# Patient Record
Sex: Female | Born: 1945
Health system: Southern US, Community
[De-identification: ages and names within clinical notes are randomized; demographics above are authoritative.]

## PROBLEM LIST (undated history)

## (undated) DIAGNOSIS — I340 Nonrheumatic mitral (valve) insufficiency: Secondary | ICD-10-CM

## (undated) DIAGNOSIS — N209 Urinary calculus, unspecified: Secondary | ICD-10-CM

## (undated) DIAGNOSIS — M4846XA Fatigue fracture of vertebra, lumbar region, initial encounter for fracture: Secondary | ICD-10-CM

## (undated) DIAGNOSIS — E785 Hyperlipidemia, unspecified: Secondary | ICD-10-CM

## (undated) DIAGNOSIS — F0280 Dementia in other diseases classified elsewhere without behavioral disturbance: Secondary | ICD-10-CM

## (undated) DIAGNOSIS — I10 Essential (primary) hypertension: Secondary | ICD-10-CM

## (undated) HISTORY — DX: Dementia in other diseases classified elsewhere, unspecified severity, without behavioral disturbance, psychotic disturbance, mood disturbance, and anxiety: F02.80

## (undated) HISTORY — DX: Nonrheumatic mitral (valve) insufficiency: I34.0

## (undated) HISTORY — DX: Urinary calculus, unspecified: N20.9

## (undated) HISTORY — DX: Hyperlipidemia, unspecified: E78.5

## (undated) HISTORY — PX: CHOLECYSTECTOMY: SHX55

## (undated) HISTORY — DX: Fatigue fracture of vertebra, lumbar region, initial encounter for fracture: M48.46XA

## (undated) HISTORY — DX: Essential (primary) hypertension: I10

## (undated) HISTORY — PX: ABDOMINAL HYSTERECTOMY: SHX81

---

## 2014-04-12 ENCOUNTER — Ambulatory Visit: Payer: Self-pay | Admitting: Internal Medicine

## 2014-04-19 ENCOUNTER — Ambulatory Visit (INDEPENDENT_AMBULATORY_CARE_PROVIDER_SITE_OTHER): Payer: Medicare HMO | Admitting: Podiatry

## 2014-04-19 ENCOUNTER — Ambulatory Visit (INDEPENDENT_AMBULATORY_CARE_PROVIDER_SITE_OTHER): Payer: Medicare HMO

## 2014-04-19 ENCOUNTER — Encounter: Payer: Self-pay | Admitting: Podiatry

## 2014-04-19 VITALS — BP 129/67 | HR 58 | Resp 16 | Ht 64.0 in | Wt 115.0 lb

## 2014-04-19 DIAGNOSIS — M216X1 Other acquired deformities of right foot: Secondary | ICD-10-CM

## 2014-04-19 DIAGNOSIS — M79609 Pain in unspecified limb: Secondary | ICD-10-CM

## 2014-04-19 DIAGNOSIS — M2140 Flat foot [pes planus] (acquired), unspecified foot: Secondary | ICD-10-CM

## 2014-04-19 DIAGNOSIS — M216X2 Other acquired deformities of left foot: Secondary | ICD-10-CM

## 2014-04-19 DIAGNOSIS — M779 Enthesopathy, unspecified: Secondary | ICD-10-CM

## 2014-04-19 NOTE — Progress Notes (Signed)
   Subjective:    Patient ID: Cindy Patel, female    DOB: 1946-05-11, 68 y.o.   MRN: [id number]  HPI Comments: Cindy Patel, 68 year old female, presents the office today with bilateral arch pain as well as spasms in her feet. She states it has been ongoing for between 2-4 years and has been getting worse. She states that she at times has pain in her feet at night as well as with ambulation. He typically wears high heeled shoes or platform shoes. She stands a lot while working on the computer and status sitting. While standing she continues to wear heeled shoes. She denies any cramps in her legs during the day or any pain in the legs with ambulation. No numbness or tingling. No history of trauma or injury to the area. No other complaints at this time.  Foot Pain      Review of Systems  HENT: Positive for sinus pressure.   Skin:       Change in nails  Allergic/Immunologic: Positive for environmental allergies.       Objective:   Physical Exam AAO x3, NAD DP/PT pulses palpable 2/4 b/l, CRT < 3 sec Bursitis sensation intact with Simms Weinstein monofilament, vibratory sensation intact, Achilles tendon reflex intact. No pinpoint bony tenderness or pain with vibratory sensation. Decrease in medial arch height upon weightbearing. Equinus bilaterally. Fat pad atrophy on the metatarsal heads. No pain along the course the metatarsal heads. No pain along the course the digits. No pain along the calcaneus at the insertion of the plantar fascia. ROM WNL, MMT 5/5 No open skin lesions or pre-ulcerative lesions. No leg pain, swelling, warmth.        Assessment & Plan:  68 year old female with bilateral flatfoot deformity with arch pain, cramping and her toes. -X-Rays were obtained and reviewed with the patient. -Conservative versus surgical treatment were discussed including alternatives, risks, complications. -Discussed that given her flatfeet with the increase standing and high-heeled  shoes recently this is likely contributing to her arch pain. Discussed use of supportive shoe with an orthotic to help accommodate her foot type with help support her arch.  -Discussed stretching exercises. -Cramping is not likely due to vascular origin and she has no claudication symptoms (able to exercise/ambulate without pain/cramping) and she good pulses and this has been going on for many years. This is likely due to biomechanics however if the pain persists we'll rule out other causes. -Followup as needed or call with any questions, concerns, change in symptoms. Also call if symptoms persist.

## 2014-05-03 ENCOUNTER — Telehealth: Payer: Self-pay | Admitting: *Deleted

## 2014-05-03 NOTE — Telephone Encounter (Signed)
Called and spoke with pt letting her know per dr Jacqualyn Posey she can go to [employer] in [place] to get inserts for her shoes. Pt understood.

## 2014-05-03 NOTE — Telephone Encounter (Signed)
Pt came in stated you told her she needed arch supports for her shoes. Stated you never told her where to get them. She is wanting over the counter inserts for high heel / dress shoes. Where do you suggest she go to purchase these?

## 2015-06-13 ENCOUNTER — Encounter: Payer: Self-pay | Admitting: Physical Therapy

## 2015-06-13 ENCOUNTER — Ambulatory Visit: Payer: PPO | Attending: Neurosurgery | Admitting: Physical Therapy

## 2015-06-13 DIAGNOSIS — R29898 Other symptoms and signs involving the musculoskeletal system: Secondary | ICD-10-CM | POA: Insufficient documentation

## 2015-06-13 DIAGNOSIS — M5442 Lumbago with sciatica, left side: Secondary | ICD-10-CM | POA: Diagnosis not present

## 2015-06-13 DIAGNOSIS — M2560 Stiffness of unspecified joint, not elsewhere classified: Secondary | ICD-10-CM | POA: Diagnosis present

## 2015-06-13 DIAGNOSIS — M5386 Other specified dorsopathies, lumbar region: Secondary | ICD-10-CM

## 2015-06-13 NOTE — Patient Instructions (Signed)
Side Stretch    With feet shoulder width apart, hands on hips, inhale. Then exhale while bending directly to Left  side, keeping top arm outstretched. Hold 3 count. Slowly return to starting position.  Repeat _12___ times, each side.  Copyright  VHI. All rights reserved.  Abductor Strength: Side Plank Pose, on Knees    On Right side, Press down with bottom knee to lift hips. Hold for __5-10 sceonds. Repeat __5 __ times   Copyright  VHI. All rights reserved.

## 2015-06-14 NOTE — Therapy (Addendum)
[place] MAIN [facility] SERVICES [address] [place], [place], [address] Phone: [phone]   Fax:  [phone]  Physical Therapy Evaluation  Patient Details  Name: Cindy Patel MRN: [id number] Date of Birth: Aug 26, 1945 Referring Provider: Erline Levine  Encounter Date: 06/13/2015      PT End of Session - 06/13/15 1657    Visit Number 1   Number of Visits 17   Date for PT Re-Evaluation 08/08/15   Authorization Type Gcode 1    Authorization Time Period 10    PT Start Time [id number]   PT Stop Time 1653   PT Time Calculation (min) 68 min   Activity Tolerance Patient tolerated treatment well   Behavior During Therapy [facility] for tasks assessed/performed      History reviewed. No pertinent past medical history.  History reviewed. No pertinent past surgical history.  There were no vitals filed for this visit.  Visit Diagnosis:  Left-sided low back pain with left-sided sciatica  Leg weakness, bilateral  Decreased ROM of lumbar spine      Subjective Assessment - 06/13/15 1553    Subjective Patient is a pleasant 69 year old female that reports to PT due to back pain and from scoliosis. She reports that in June of this year she was getting gas and bent over and started to feel a burning sensation in the L hip and low back. The pain was so bad that she had to leave the next store. She reports that she received Chiropractor treatment  for 3 weeks with slight improvement. She went for a car trip after chiropractor treatment the pain returned to prior level.  Pain medication helps slightly. At worst, patient reports that her pain is 9/10 and gets worse with walking on concrete and standing for long periods of time with pain in the L hip that radiates into the  L calf and ankle at times.      Pertinent History Hx of Uterine Cancer 10 years ago. Gall bladder removal 15 years ago.     Limitations Sitting;Standing;Walking   How long can you stand  comfortably? 30 minutes    How long can you walk comfortably? less than 5 minutes.    Diagnostic tests MRI: L convex Scoliosis. mild foraminal narrowing L2-L5.    Patient Stated Goals eliminate pain. Be able to do exercises at home.    Currently in Pain? Yes   Pain Score 2    Pain Location Hip   Pain Orientation Left   Pain Descriptors / Indicators Burning   Pain Type Chronic pain   Pain Radiating Towards L LE into calf and ankle.    Pain Onset More than a month ago   Pain Frequency Intermittent   Aggravating Factors  walking,   Pain Relieving Factors sitting and lying down.    Effect of Pain on Daily Activities has made patient a little lazier.             [facility] PT Assessment - 06/14/15 0001    Assessment   Medical Diagnosis Scoliosis    Referring Provider Erline Levine   Onset Date/Surgical Date 12/28/14   Hand Dominance Right   Prior Therapy Chiropractor treatment. mild relief.    Precautions   Precautions None   Restrictions   Weight Bearing Restrictions No   Balance Screen   Has the patient fallen in the past 6 months No   Has the patient had a decrease in activity level because of a  fear of falling?  Yes   Is the patient reluctant to leave their home because of a fear of falling?  No   Home Environment   Living Environment Private residence   Living Arrangements Spouse/significant other   Available Help at Discharge Family   Type of [place] to enter   Entrance Stairs-Number of Steps 5   Entrance Stairs-Rails Right   Home Layout Two level   Alternate Level Stairs-Number of Steps 17   Alternate Level Stairs-Rails Left   Additional Comments Patient able to get to second floor with mild discomfort.    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    Leisure shopping, cards teaching.    Cognition   Overall Cognitive Status Within Functional Limits for tasks  assessed   Observation/Other Assessments   Modified [place] 34 % ( indicates moderate disability)    Sensation   Light Touch Appears Intact   Additional Comments increased prominence of the L sided rib cage with forward flexion.    Functional Tests   Functional tests Sit to Stand   Sit to Stand   Comments Increased weight bearing on the R LE with sit to stand.    Posture/Postural Control   Posture Comments Patient sits and stands with decreased lumbar lordosis, mild forward head and rounded shoulders. also demonstrates mild L lateral shif.    AROM   Overall AROM Comments end range lumbar flexion, R rotation and extension increase pain in lumber region as well as lateral hip pain. All LE ROM WNL.    Lumbar Flexion 40 degrees.    Lumbar Extension 12 degrees    Lumbar - Right Side Bend knee jt line   Lumbar - Left Side Bend 4cm less than knee jt line.    Lumbar - Right Rotation 40   Lumbar - Left Rotation 65   Strength   Overall Strength Comments Mild increase in hip pain with L LE abduction, adduction, and extension.    Right Hip Flexion 4/5   Right Hip ABduction 4+/5   Right Hip ADduction 4+/5   Left Hip Flexion 4-/5   Left Hip ABduction 4+/5   Left Hip ADduction 4+/5   Right Knee Flexion 4+/5   Right Knee Extension 5/5   Left Knee Flexion 4+/5   Left Knee Extension 4+/5   Palpation   Palpation comment Mild tenderness in the L thoracic paraspinal region, Mild tenderness in the L lateral hip, decreased spinal mobility with grade III mobilizations.    FABER test   findings Negative   Side LEft   Slump test   Findings Negative   Side Left   Straight Leg Raise   Findings Positive   Side  Left   Comment Increased pain/ burning in the HS and calf.    Pelvic Compression   Findings Negative   Side Left   Gaenslen's test   Findings Negative   Sacral Compression   Findings Negative   Trendelenburg Test   Findings Positive   Side Left   Ely's Test   Findings Positive    Side Left   Comments positive bilaterally    Piriformis Test   Findings Negative   Side  Left   Ambulation/Gait   Gait Comments Patient ambulates with reciprocal gait pattern but has minimal pelvic rotation and no trunk rotation.    Standardized Balance Assessment   Five  times sit to stand comments  11 seconds ( <15 seconds indicates decreased fall risk    10 Meter Walk 0.67m/s no AD ( <1.0 m/s indicates increased fall risk)    High Level Balance   High Level Balance Comments Patient demonstrates decreased SLS stance time with contralateral hip drop with stance on the L LE.           Treatment:   Patient instructed in  Standing side stretch to the L 5 x 10 second hold  R sided bridge x 10, 5 second hold  PT provided moderate verbal instruction for improve positioning, increased ROM, instructed to not go past neutral with bridge, and improve hold time. Patient responded very well to instruction from PT.                  PT Education - 06/13/15 1659    Education provided Yes   Education Details plan of care. HEP - see patient instructions    Person(s) Educated Patient   Methods Explanation;Demonstration;Tactile cues;Verbal cues;Handout   Comprehension Verbalized understanding;Returned demonstration;Verbal cues required;Tactile cues required             PT Long Term Goals - 06/14/15 0748    PT LONG TERM GOAL #1   Title Patient will be independent with HEP to improve strength and improve ROM to allow increased ease with ADLs by 08/08/15    Time 8   Period Weeks   Status New   PT LONG TERM GOAL #2   Title Patient will reduce self reported disability by reducing Modified ODI to <20% impaired to allow return to PLOF by 08/08/15   Time 8   Period Weeks   Status New   PT LONG TERM GOAL #3   Title Patient will improve normal gait speed to >1.0 m/s to indicate reduced fall risk at home and in the community by 08/08/15   Time 8   Period Weeks   Status New   PT  LONG TERM GOAL #4   Title Patient will increase LE strength to at least 4+/5 throughout bilateral LE to allow improve ease of access to the second story of home by 08/08/15   Time 8   Period Weeks   Status New   PT LONG TERM GOAL #5   Title Patient will decrease pain to 2/10 at worst to allow 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 Patient demonstrates decreased strength in BLE with greater decrease in strength in the L LE. Lumbar and thoracic ROM was found to be limited especially in extension and R rotation, each caused pain in the low back and the L hip at end range. Patient demonstrates impairments with gait with decreased walking speed and decreased hip and trunk rotation. Decreased spinal mobility was noted with R and L Rotation as well as AP mobilizations in the thoracic and lumbar spine; no increase in radicular symptoms with mobility testing.  Positive L LE SLR special test was noted as well as increased radicular symptoms in to the L hip with MMT of the L hip. This patient would benefit from Skilled PT in order  to improve spinal mobility and improve lumbar and thoracic ROM, increase LE strength and improve gait mechanics to allow return to PLOF and reduce pain.   Pt will benefit from skilled therapeutic intervention in order to improve on the following deficits Difficulty walking;Decreased range of motion;Decreased activity tolerance;Decreased strength;Decreased mobility;Decreased balance;Hypomobility;Increased fascial restricitons;Impaired flexibility;Postural dysfunction;Improper body mechanics;Pain;Abnormal gait   Rehab Potential Good   Clinical Impairments Affecting Rehab Potential Positive: recent onset of symptoms, minimal  co-morbidities. Negative: severity of symptoms, progressive congition.    PT Frequency 2x / week   PT Duration 8 weeks   PT Treatment/Interventions ADLs/Self Care Home Management;Electrical Stimulation;Cryotherapy;Moist Heat;Traction;Ultrasound;Gait training;Stair training;Functional mobility training;Therapeutic activities;Therapeutic exercise;Balance training;Neuromuscular re-education;Patient/family education;Manual techniques;Passive range of motion;Dry needling   PT Next Visit Plan 6 minute walk test, increase HEP for lumbar thoracic strengthening.    PT Home Exercise Plan see patient instructions.    Consulted and Agree with Plan of Care Patient     Gcodes: Based on modified oswestry and clinical judgement Current: G8978CJ- 20-40% impaired Goal: G8979CI- 1-20% impaired    Problem List There are no active problems to display for this patient.  Barrie Folk SPT 06/14/2015   3:06 PM  This entire session was performed under direct supervision and direction of a licensed therapist/therapist assistant . I have personally read, edited and approve of the note as written.  Hopkins,Margaret PT, DPT 06/14/2015, 3:06 PM  [place] MAIN [facility] SERVICES [address] [place], [place], [address] Phone: [phone]   Fax:  [phone]  Name: VICTORIAROSE DUSEK MRN: [id number] Date of Birth: 12/10/1945

## 2015-06-15 ENCOUNTER — Ambulatory Visit: Payer: PPO

## 2015-06-15 DIAGNOSIS — M5442 Lumbago with sciatica, left side: Secondary | ICD-10-CM | POA: Diagnosis not present

## 2015-06-15 DIAGNOSIS — M5386 Other specified dorsopathies, lumbar region: Secondary | ICD-10-CM

## 2015-06-15 NOTE — Therapy (Signed)
[place] MAIN [facility] SERVICES [address] [place], [place], [address] Phone: [phone]   Fax:  [phone]  Physical Therapy Treatment  Patient Details  Name: JHANAE RATHBUN MRN: [id number] Date of Birth: May 26, 1946 Referring Provider: Erline Levine  Encounter Date: 06/15/2015      PT End of Session - 06/15/15 1657    Visit Number 2   Number of Visits 17   Date for PT Re-Evaluation 08/08/15   Authorization Type Gcode 1    Authorization Time Period 10    PT Start Time [id number]   PT Stop Time 1635   PT Time Calculation (min) 45 min   Activity Tolerance Patient tolerated treatment well   Behavior During Therapy [facility] for tasks assessed/performed      History reviewed. No pertinent past medical history.  History reviewed. No pertinent past surgical history.  There were no vitals filed for this visit.  Visit Diagnosis:  Left-sided low back pain with left-sided sciatica  Decreased ROM of lumbar spine      Subjective Assessment - 06/15/15 1556    Subjective Pt reports she has been performing HEP. She reports no change in pain since onset and has no specific questions or concerns at this time.    Pertinent History Hx of Uterine Cancer 10 years ago. Gall bladder removal 15 years ago.     Limitations Sitting;Standing;Walking   How long can you stand comfortably? 30 minutes    How long can you walk comfortably? less than 5 minutes.    Diagnostic tests MRI: L convex Scoliosis. mild foraminal narrowing L2-L5.    Patient Stated Goals eliminate pain. Be able to do exercises at home.    Currently in Pain? Yes   Pain Score 4    Pain Location Hip  Pain in L lower back and in L lateral calf   Pain Orientation Left   Pain Descriptors / Indicators Aching   Pain Onset More than a month ago      Treatment  Manual Therapy Grade I CPA L1-L5 20 seconds/bout x 2 bout/level; STM to L lumbar parspinals with ischemic compression and trigger  point release. Gradually progressed force. Pt with very considerable trigger points in L low back with referred pain into LLE. Pt reports complete resolution of low back pain (4/10 to 0/10) following STM of low back;  There-ex R sidelying knee bridges 2 x 10; Standing L lateral flexion stretch 3 second hold x 12; Door way side stretch for R lats and R lumbar spine 20 second hold x 3; Prone press ups x 10 with mild overpressure; Standing lumbar extension with hands along lumbar spine x 10, pt reports reduction in anterior L calf pain following repeated extension (added to HEP);                           PT Education - 06/15/15 1657    Education provided Yes   Education Details HEP progression   Person(s) Educated Patient   Methods Explanation;Demonstration;Tactile cues;Verbal cues   Comprehension Verbalized understanding;Returned demonstration             PT Long Term Goals - 06/14/15 0748    PT LONG TERM GOAL #1   Title Patient will be independent with HEP to improve strength and improve ROM to allow increased ease with ADLs by 08/08/15    Time 8   Period Weeks   Status New   PT LONG  TERM GOAL #2   Title Patient will reduce self reported disability by reducing Modified ODI to <20% impaired to allow return to PLOF by 08/08/15   Time 8   Period Weeks   Status New   PT LONG TERM GOAL #3   Title Patient will improve normal gait speed to >1.0 m/s to indicate reduced fall risk at home and in the community by 08/08/15   Time 8   Period Weeks   Status New   PT LONG TERM GOAL #4   Title Patient will increase LE strength to at least 4+/5 throughout bilateral LE to allow improve ease of access to the second story of home by 08/08/15   Time 8   Period Weeks   Status New   PT LONG TERM GOAL #5   Title Patient will decrease pain to 2/10 at worst to allow 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 Pt with significant trigger points along L lumbar paraspinals. Pt reports continued pain in L anterior calf and L hip which improves with repeated standing and prone extension. Pt with very poor lumbar extension ROM. Added standing prone extension to HEP 10 reps every 2 hours throughout the day. Encouraged pt to continue additional HEP and follow-up as scheduled.    Pt will benefit from skilled therapeutic intervention in order to improve on the following deficits Difficulty walking;Decreased range of motion;Decreased activity tolerance;Decreased strength;Decreased mobility;Decreased balance;Hypomobility;Increased fascial restricitons;Impaired flexibility;Postural dysfunction;Improper body mechanics;Pain;Abnormal gait   Rehab Potential Good   Clinical Impairments Affecting Rehab Potential Positive: recent onset of symptoms, minimal co-morbidities. Negative: severity of symptoms, progressive congition.    PT Frequency 2x / week   PT Duration 8 weeks   PT Treatment/Interventions ADLs/Self Care Home Management;Electrical Stimulation;Cryotherapy;Moist Heat;Traction;Ultrasound;Gait training;Stair training;Functional mobility training;Therapeutic activities;Therapeutic exercise;Balance training;Neuromuscular re-education;Patient/family education;Manual techniques;Passive range of motion;Dry needling   PT Next Visit Plan 6 minute walk test, progress strengthening and soft tissue mobilization   PT Home Exercise Plan Added standing lumbar extension, 10 reps every 2 hours during the daytime.    Consulted and Agree with Plan of Care Patient        Problem List There are no active problems to display for this patient.   Phillips Grout PT, DPT   Huprich,Jason 06/15/2015, 5:02 PM  [place] MAIN [facility] SERVICES [address] [place], [place], [address] Phone: [phone]   Fax:  [phone]  Name: GENIEL BORELLI MRN: [id number] Date of Birth: 11/29/1945

## 2015-06-20 ENCOUNTER — Encounter: Payer: PPO | Admitting: Physical Therapy

## 2015-06-21 ENCOUNTER — Encounter: Payer: Self-pay | Admitting: Physical Therapy

## 2015-06-21 ENCOUNTER — Ambulatory Visit: Payer: PPO | Admitting: Physical Therapy

## 2015-06-21 DIAGNOSIS — M5442 Lumbago with sciatica, left side: Secondary | ICD-10-CM | POA: Diagnosis not present

## 2015-06-21 DIAGNOSIS — R29898 Other symptoms and signs involving the musculoskeletal system: Secondary | ICD-10-CM

## 2015-06-21 DIAGNOSIS — M5386 Other specified dorsopathies, lumbar region: Secondary | ICD-10-CM

## 2015-06-21 NOTE — Therapy (Signed)
[place] MAIN [facility] SERVICES [address] [place], [place], [address] Phone: [phone]   Fax:  [phone]  Physical Therapy Treatment  Patient Details  Name: Cindy Patel MRN: [id number] Date of Birth: May 02, 1946 Referring Provider: Erline Levine  Encounter Date: 06/21/2015      PT End of Session - 06/21/15 1542    Visit Number 3   Number of Visits 17   Date for PT Re-Evaluation 08/08/15   Authorization Type Gcode 3   Authorization Time Period 10    PT Start Time [id number]   PT Stop Time 1430   PT Time Calculation (min) 45 min   Activity Tolerance Patient tolerated treatment well;No increased pain   Behavior During Therapy [facility] for tasks assessed/performed      History reviewed. No pertinent past medical history.  History reviewed. No pertinent past surgical history.  There were no vitals filed for this visit.  Visit Diagnosis:  Left-sided low back pain with left-sided sciatica  Decreased ROM of lumbar spine  Leg weakness, bilateral      Subjective Assessment - 06/21/15 1405    Subjective Patient reports increased back soreness when she walks. Currently she is just having discomfort in her ankle. However, last night she reports having lower leg pain on the left side.    Pertinent History Hx of Uterine Cancer 10 years ago. Gall bladder removal 15 years ago.     Limitations Sitting;Standing;Walking   How long can you stand comfortably? 30 minutes    How long can you walk comfortably? less than 5 minutes.    Diagnostic tests MRI: L convex Scoliosis. mild foraminal narrowing L2-L5.    Patient Stated Goals eliminate pain. Be able to do exercises at home.    Currently in Pain? Yes   Pain Score 2    Pain Location Back   Pain Orientation Left   Pain Descriptors / Indicators Sore   Pain Onset More than a month ago           TREATMENT: PT assessed patient's low back. She continues to have tightness in left lumbar  paraspinals. Patient exhibits a scoliotic curve with lumbar left rotation of spine.  PT performed grade I-II PA mobs along left lumbar transverse process, 10 sec bouts x3 reps at L2, L3, L4,  Patient reports increased pain along L5 during palpation; she reports no increase in pain with joint mobilizations; PT identified increased tightness along left lumbar paraspinals but denies any tenderness or increased referred pain.  Instructed patient in lumbar ROM/strengthening exercise: Right sidelying pelvic lifts x10 reps; Standing left lateral flexion x10 reps; PT instructed patient to increase repetitions of HEP for increased flexibility; Also instructed patient to hold off of lumbar extension as that seemed to increase back pain;  Instructed patient in qped prayer stretch 15 sec hold x3 reps; Qped prayer stretch with hands and feet rotated to left for increased right QL stretch 15 sec hold x3; Qped RUE right trunk rotation x5 reps;  PT advanced HEP- see patient instructions.  Patient required min-moderate verbal/tactile cues for correct exercise technique including min VCs for correct positioning and to avoid painful ROM. Patient denies any increase in pain with advanced exercise.                        PT Education - 06/21/15 1542    Education provided Yes   Education Details HEP progression   Person(s) Educated Patient  Methods Explanation;Demonstration;Verbal cues;Handout   Comprehension Verbalized understanding;Returned demonstration;Verbal cues required             PT Long Term Goals - 06/14/15 0748    PT LONG TERM GOAL #1   Title Patient will be independent with HEP to improve strength and improve ROM to allow increased ease with ADLs by 08/08/15    Time 8   Period Weeks   Status New   PT LONG TERM GOAL #2   Title Patient will reduce self reported disability by reducing Modified ODI to <20% impaired to allow return to PLOF by 08/08/15   Time 8   Period  Weeks   Status New   PT LONG TERM GOAL #3   Title Patient will improve normal gait speed to >1.0 m/s to indicate reduced fall risk at home and in the community by 08/08/15   Time 8   Period Weeks   Status New   PT LONG TERM GOAL #4   Title Patient will increase LE strength to at least 4+/5 throughout bilateral LE to allow improve ease of access to the second story of home by 08/08/15   Time 8   Period Weeks   Status New   PT LONG TERM GOAL #5   Title Patient will decrease pain to 2/10 at worst to allow 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 She reports no significant change over last week. Patient was instructed in lumbar stretches to help reduce tightness in paraspinals. Patient required min VCs for correct exercise technique. PT also performed manual therapy to improve joint mobility. Patient would benefit from additional skilled PT intervention to improve ROM, posture and reduce back pain;    Pt will benefit from skilled therapeutic intervention in order to improve on the following deficits Difficulty walking;Decreased range of motion;Decreased activity tolerance;Decreased strength;Decreased mobility;Decreased balance;Hypomobility;Increased fascial restricitons;Impaired flexibility;Postural dysfunction;Improper body mechanics;Pain;Abnormal gait   Rehab Potential Good   Clinical Impairments Affecting Rehab Potential Positive: recent onset of symptoms, minimal co-morbidities. Negative: severity of symptoms, progressive congition.    PT Frequency 2x / week   PT Duration 8 weeks   PT Treatment/Interventions ADLs/Self Care Home Management;Electrical Stimulation;Cryotherapy;Moist Heat;Traction;Ultrasound;Gait training;Stair training;Functional mobility  training;Therapeutic activities;Therapeutic exercise;Balance training;Neuromuscular re-education;Patient/family education;Manual techniques;Passive range of motion;Dry needling   PT Next Visit Plan 6 minute walk test, progress strengthening and soft tissue mobilization   PT Home Exercise Plan see patient instructions;   Consulted and Agree with Plan of Care Patient        Problem List There are no active problems to display for this patient.   Hopkins,Delcenia Inman PT, DPT 06/21/2015, 4:31 PM  [place] MAIN [facility] SERVICES [address] [place], [place], [address] Phone: [phone]   Fax:  [phone]  Name: Cindy Patel MRN: [id number] Date of Birth: 10/10/1945

## 2015-06-21 NOTE — Patient Instructions (Signed)
BACK: Child's Pose (Sciatica)    Get on all fours (hands/knees), bring arms forward, then sit back on your heels until you feel a stretch in your low back and shoulders. Hold position for _10 seconds. Repeat __4-5_ times. Do _2__ times per day.  Copyright  VHI. All rights reserved.  BACK: Child's Pose (Sciatica)    You are going to be on all fours (hands/knees), bring feet to the left, hands to the left, then sit back on heels until you feel a  Stretch in your right low back. Hold position for  10 sec. Repeat _4-5__ times. Do _2__ times per day.  Copyright  VHI. All rights reserved.  RIB CAGE: Spinal Twist (Standing)    In kneeling positioning (all fours, hands and knees), lift right arm to right side and turn upper chest/back so that your chest is facing the right side of the wall. Hold for 5 seconds. Repeat 10 times, 2 times a day;   Copyright  VHI. All rights reserved.

## 2015-06-27 ENCOUNTER — Ambulatory Visit: Payer: PPO | Admitting: Physical Therapy

## 2015-06-27 ENCOUNTER — Encounter: Payer: Self-pay | Admitting: Physical Therapy

## 2015-06-27 DIAGNOSIS — M5442 Lumbago with sciatica, left side: Secondary | ICD-10-CM

## 2015-06-27 DIAGNOSIS — M5386 Other specified dorsopathies, lumbar region: Secondary | ICD-10-CM

## 2015-06-27 DIAGNOSIS — R29898 Other symptoms and signs involving the musculoskeletal system: Secondary | ICD-10-CM

## 2015-06-27 NOTE — Patient Instructions (Addendum)
  Upper Extremity: Chest Pass (Standing)    Stand with green band in doorway, holding banding in both hands Push arms straight out in front of you keeping core abdominal muscles tight, then slowly bring arms back to center. Repeat 12 times with band on each side of your body.  http://plyo.exer.us/20   Copyright  VHI. All rights reserved.  Low Row: Thumb Up (Single Arm)    With band in doorway, hold end in right hand, with elbow bent, pull elbow backwards squeezing shoulder blade. Repeat 12 times DO WITH RIGHT ARM ONLY  http://tub.exer.us/74   Copyright  VHI. All rights reserved.

## 2015-06-27 NOTE — Therapy (Signed)
[place] MAIN [facility] SERVICES [address] [place], [place], [address] Phone: [phone]   Fax:  [phone]  Physical Therapy Treatment  Patient Details  Name: Cindy Patel MRN: [id number] Date of Birth: 1945/08/27 Referring Provider: Erline Levine  Encounter Date: 06/27/2015      PT End of Session - 06/27/15 1201    Visit Number 4   Number of Visits 17   Date for PT Re-Evaluation 08/08/15   Authorization Type Gcode 4   Authorization Time Period 10    PT Start Time 1105   PT Stop Time 1205   PT Time Calculation (min) 60 min   Activity Tolerance Patient tolerated treatment well;No increased pain   Behavior During Therapy [facility] for tasks assessed/performed      History reviewed. No pertinent past medical history.  History reviewed. No pertinent past surgical history.  There were no vitals filed for this visit.  Visit Diagnosis:  Left-sided low back pain with left-sided sciatica  Decreased ROM of lumbar spine  Leg weakness, bilateral      Subjective Assessment - 06/27/15 1111    Subjective Patient reports less back pain today but continued LLE lower leg pain. She reports being able to walk in here well without discomfort.   Pertinent History Hx of Uterine Cancer 10 years ago. Gall bladder removal 15 years ago.     Limitations Sitting;Standing;Walking   How long can you stand comfortably? 30 minutes    How long can you walk comfortably? less than 5 minutes.    Diagnostic tests MRI: L convex Scoliosis. mild foraminal narrowing L2-L5.    Patient Stated Goals eliminate pain. Be able to do exercises at home.    Currently in Pain? Yes   Pain Score 4    Pain Location Leg   Pain Orientation Left   Pain Descriptors / Indicators Aching   Pain Type Chronic pain   Pain Onset More than a month ago         TREATMENT: Standing lumbar extension 5 sec hold x10; Standing left lateral trunk flexion with RUE overhead reach 5 sec  hold x10; Right sidelying, pelvic lifts 5 sec hold x10 with min VCS to avoid rotating and improve posture; Ardine Eng pose 20 sec hold x3 (neutral); Ardine Eng pose with hands/feet to left for increased right lateral flexion stretch 20 sec hold x3; Qped RUE trunk rotation 5 sec hold x5; Patient reports increased difficulty performing qped RUE trunk rotation with min VCs to increase forward weight shift for better qped positioning. Instructed patient in left sidelying right UE trunk rotation to improve thoracic right trunk rotation. Patient required min VCs for correct posture/positioning with HEP for better tolerance with exercise. Reinstructed patient to perform qped stretches 2x a day even if feeling pain to help reduce tightness and discomfort. Also recommended patient performed qped exercise on bed for better tolerance and less discomfort.   Instructed patient in advanced strengthening exercise: BUE pal-off press with green tband x12 bilaterally; RUE diagonals with green tband x12; RUE mid row with cues for increased scapular retraction with green tband x10; Patient required mod Vcs to increase scapular retraction for better postural strengthening.   Finished with interferential TENs to lumbar paraspinals x15 min concurrent with moist heat in sitting. Intensity at patient's tolerance (#7.4-10.0) Patient reports no pain in back or LLE following TENs treatment.  PT Education - 06/27/15 1201    Education provided Yes   Education Details HEP progression, TENS   Person(s) Educated Patient   Methods Explanation;Verbal cues;Demonstration   Comprehension Verbalized understanding;Returned demonstration;Verbal cues required             PT Long Term Goals - 06/14/15 0748    PT LONG TERM GOAL #1   Title Patient will be independent with HEP to improve strength and improve ROM to allow increased ease with ADLs by 08/08/15    Time 8   Period Weeks    Status New   PT LONG TERM GOAL #2   Title Patient will reduce self reported disability by reducing Modified ODI to <20% impaired to allow return to PLOF by 08/08/15   Time 8   Period Weeks   Status New   PT LONG TERM GOAL #3   Title Patient will improve normal gait speed to >1.0 m/s to indicate reduced fall risk at home and in the community by 08/08/15   Time 8   Period Weeks   Status New   PT LONG TERM GOAL #4   Title Patient will increase LE strength to at least 4+/5 throughout bilateral LE to allow improve ease of access to the second story of home by 08/08/15   Time 8   Period Weeks   Status New   PT LONG TERM GOAL #5   Title Patient will decrease pain to 2/10 at worst to allow 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 Advanced HEP with RUE postural strengthening and gluteal strengthening for better trunk support. Patient reports no increase in back pain with advanced exercise. She continues to require mod VCs for correct posture during qped exercise for better strengthening. PT applied TENs to lumbar paraspinals at end of treatment session concurrent with moist heat. Patient reports no pain after treatment session including no LLE radicular symptoms. She would benefit from additional skilled PT intervention to reduce back pain and improve posture for return to PLOF.   Pt will benefit from skilled therapeutic intervention in order to improve on the following deficits Difficulty walking;Decreased range of motion;Decreased activity tolerance;Decreased strength;Decreased mobility;Decreased balance;Hypomobility;Increased fascial restricitons;Impaired flexibility;Postural dysfunction;Improper body mechanics;Pain;Abnormal gait   Rehab Potential Good   Clinical Impairments Affecting Rehab Potential  Positive: recent onset of symptoms, minimal co-morbidities. Negative: severity of symptoms, progressive congition.    PT Frequency 2x / week   PT Duration 8 weeks   PT Treatment/Interventions ADLs/Self Care Home Management;Electrical Stimulation;Cryotherapy;Moist Heat;Traction;Ultrasound;Gait training;Stair training;Functional mobility training;Therapeutic activities;Therapeutic exercise;Balance training;Neuromuscular re-education;Patient/family education;Manual techniques;Passive range of motion;Dry needling   PT Next Visit Plan  progress strengthening and soft tissue mobilization   PT Home Exercise Plan see patient instructions;   Consulted and Agree with Plan of Care Patient        Problem List There are no active problems to display for this patient.   Hopkins,Lavilla Delamora PT, DPT 06/27/2015, 1:39 PM  [place] MAIN [facility] SERVICES [address] [place], [place], [address] Phone: [phone]   Fax:  [phone]  Name: Cindy Patel MRN: [id number] Date of Birth: September 29, 1945

## 2015-06-29 ENCOUNTER — Ambulatory Visit: Payer: PPO | Attending: Neurosurgery | Admitting: Physical Therapy

## 2015-06-29 ENCOUNTER — Encounter: Payer: Self-pay | Admitting: Physical Therapy

## 2015-06-29 DIAGNOSIS — R29898 Other symptoms and signs involving the musculoskeletal system: Secondary | ICD-10-CM | POA: Insufficient documentation

## 2015-06-29 DIAGNOSIS — M2560 Stiffness of unspecified joint, not elsewhere classified: Secondary | ICD-10-CM | POA: Insufficient documentation

## 2015-06-29 DIAGNOSIS — M5386 Other specified dorsopathies, lumbar region: Secondary | ICD-10-CM

## 2015-06-29 DIAGNOSIS — M5442 Lumbago with sciatica, left side: Secondary | ICD-10-CM | POA: Diagnosis not present

## 2015-06-29 NOTE — Therapy (Signed)
[place] MAIN [facility] SERVICES [address] [place], [place], [address] Phone: [phone]   Fax:  [phone]  Physical Therapy Treatment  Patient Details  Name: Cindy Patel MRN: [id number] Date of Birth: 03/13/1946 Referring Provider: Erline Levine  Encounter Date: 06/29/2015      PT End of Session - 06/29/15 1132    Visit Number 5   Number of Visits 17   Date for PT Re-Evaluation 08/08/15   Authorization Type Gcode 5   Authorization Time Period 10    PT Start Time [id number]   PT Stop Time 1120   PT Time Calculation (min) 45 min   Activity Tolerance Patient tolerated treatment well;No increased pain   Behavior During Therapy [facility] for tasks assessed/performed      History reviewed. No pertinent past medical history.  History reviewed. No pertinent past surgical history.  There were no vitals filed for this visit.  Visit Diagnosis:  Left-sided low back pain with left-sided sciatica  Decreased ROM of lumbar spine  Leg weakness, bilateral      Subjective Assessment - 06/29/15 1041    Subjective Patient reports no pain today in either back or leg. She reports doing stretches more which has helped. Patient reports pain relief after last visit for afternoon and then back started back hurting in the evening around dinnertime.    Pertinent History Hx of Uterine Cancer 10 years ago. Gall bladder removal 15 years ago.     Limitations Sitting;Standing;Walking   How long can you stand comfortably? 30 minutes    How long can you walk comfortably? less than 5 minutes.    Diagnostic tests MRI: L convex Scoliosis. mild foraminal narrowing L2-L5.    Patient Stated Goals eliminate pain. Be able to do exercises at home.    Currently in Pain? No/denies   Pain Onset More than a month ago         TREATMENT: [place]: Lumbar trunk rotation with pball x2 min with min Vcs to avoid painful ROM Double knee to chest with pball 5 sec hold  x5;  Left sidelying: RUE bow and arrow with green tband x10; Right oblique crunch x10; Patient required min Vcs for correct posture for less discomfort along back. She required min Vcs to increase trunk rotation with bow and arrow pull for increased scapular and thoracic strengthening;  Right sidelying: Side planks on elbow and knees 5 sec hold x10;  Qped childs pose with BUE and BLE to left (right QL stretch) 15 sec hold x2; Qped- alternate LE hip extension x5 bilaterally. PT utilized ruler along back for increased tactile cues to improve core stabilization and avoid trunk rotation/slump during LE movement.  Finished with IFC (interferential) TENs to bilateral lumbar paraspinals in sitting x15 min at tolerated intensity (#8.6) concurrent with moist heat.  Patient denies any pain following treatment session. She reports no stiffness and that she "feels great" after treatment session.                          PT Education - 06/29/15 1132    Education provided Yes   Education Details postural strengthening, TENs   Person(s) Educated Patient   Methods Explanation;Verbal cues;Tactile cues   Comprehension Verbalized understanding;Returned demonstration;Verbal cues required;Tactile cues required             PT Long Term Goals - 06/14/15 0748    PT LONG TERM GOAL #1   Title Patient  will be independent with HEP to improve strength and improve ROM to allow increased ease with ADLs by 08/08/15    Time 8   Period Weeks   Status New   PT LONG TERM GOAL #2   Title Patient will reduce self reported disability by reducing Modified ODI to <20% impaired to allow return to PLOF by 08/08/15   Time 8   Period Weeks   Status New   PT LONG TERM GOAL #3   Title Patient will improve normal gait speed to >1.0 m/s to indicate reduced fall risk at home and in the community by 08/08/15   Time 8   Period Weeks   Status New   PT LONG TERM GOAL #4   Title Patient will increase LE  strength to at least 4+/5 throughout bilateral LE to allow improve ease of access to the second story of home by 08/08/15   Time 8   Period Weeks   Status New   PT LONG TERM GOAL #5   Title Patient will decrease pain to 2/10 at worst to allow 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 Patient required min VCs for correct exercise technique. She did report mild increase in discomfort with sidelying crunches. However, patient reports less pain after supine single knee to chest stretch. She responds well to exercise with less discomfort and flexibility. PT finished with TENs for increased pain relief. Patient denies any stiffness or pain following treatment session. She would benefit from additional skilled PT intervention to improve core abdominal/back strength and reduce discomfort.    Pt will benefit from skilled therapeutic intervention in order to improve on the following deficits Difficulty walking;Decreased range of motion;Decreased activity tolerance;Decreased strength;Decreased mobility;Decreased balance;Hypomobility;Increased fascial restricitons;Impaired flexibility;Postural dysfunction;Improper body mechanics;Pain;Abnormal gait   Rehab Potential Good   Clinical Impairments Affecting Rehab Potential Positive: recent onset of symptoms, minimal co-morbidities. Negative: severity of symptoms, progressive congition.    PT Frequency 2x / week   PT Duration 8 weeks   PT Treatment/Interventions ADLs/Self Care Home Management;Electrical Stimulation;Cryotherapy;Moist Heat;Traction;Ultrasound;Gait training;Stair training;Functional mobility training;Therapeutic activities;Therapeutic exercise;Balance training;Neuromuscular re-education;Patient/family education;Manual techniques;Passive  range of motion;Dry needling   PT Next Visit Plan  progress strengthening and soft tissue mobilization, TENs, ASSESS GOALS FOR MD VISIT   PT Home Exercise Plan continue as previously given.   Consulted and Agree with Plan of Care Patient        Problem List There are no active problems to display for this patient.   Hopkins,Blaine Guiffre PT, DPT 06/29/2015, 11:35 AM  [place] MAIN [facility] SERVICES [address] [place], [place], [address] Phone: [phone]   Fax:  [phone]  Name: Cindy Patel MRN: [id number] Date of Birth: April 30, 1946

## 2015-07-04 ENCOUNTER — Encounter: Payer: Self-pay | Admitting: Physical Therapy

## 2015-07-04 ENCOUNTER — Ambulatory Visit: Payer: PPO | Admitting: Physical Therapy

## 2015-07-04 DIAGNOSIS — M5442 Lumbago with sciatica, left side: Secondary | ICD-10-CM

## 2015-07-04 DIAGNOSIS — M5386 Other specified dorsopathies, lumbar region: Secondary | ICD-10-CM

## 2015-07-04 DIAGNOSIS — R29898 Other symptoms and signs involving the musculoskeletal system: Secondary | ICD-10-CM

## 2015-07-04 NOTE — Therapy (Signed)
[place] MAIN [facility] SERVICES [address] [place], [place], [address] Phone: [phone]   Fax:  [phone]  Physical Therapy Treatment/Progress note 06/13/15 to 07/04/15  Patient Details  Name: Cindy Patel MRN: [id number] Date of Birth: 09/14/1945 Referring Provider: Erline Levine  Encounter Date: 07/04/2015      PT End of Session - 07/04/15 1702    Visit Number 6   Number of Visits 17   Date for PT Re-Evaluation 08/08/15   Authorization Type Gcode 1   Authorization Time Period 10    PT Start Time 1615   PT Stop Time 6378   PT Time Calculation (min) 60 min   Activity Tolerance Patient limited by pain   Behavior During Therapy [facility] for tasks assessed/performed      History reviewed. No pertinent past medical history.  History reviewed. No pertinent past surgical history.  There were no vitals filed for this visit.  Visit Diagnosis:  Left-sided low back pain with left-sided sciatica  Decreased ROM of lumbar spine  Leg weakness, bilateral      Subjective Assessment - 07/04/15 1622    Subjective Patient reports increased soreness in her back today. She reports compliance with her HEP; Patient reports feeling better over the weekend but is having more pain.    Pertinent History Hx of Uterine Cancer 10 years ago. Gall bladder removal 15 years ago.     Limitations Sitting;Standing;Walking   How long can you stand comfortably? 30 minutes    How long can you walk comfortably? less than 5 minutes.    Diagnostic tests MRI: L convex Scoliosis. mild foraminal narrowing L2-L5.    Patient Stated Goals eliminate pain. Be able to do exercises at home.    Currently in Pain? Yes   Pain Score 10-Worst pain ever   Pain Location Back   Pain Orientation Lower;Left   Pain Descriptors / Indicators Sore;Sharp   Pain Type Chronic pain   Pain Onset More than a month ago            [facility] PT Assessment - 07/04/15 0001    Observation/Other Assessments   Modified [place] 38% (moderate disability); slightly more impaired from initial eval on 06/13/15 which was 34%   Strength   Right Hip Flexion 4+/5   Right Hip ABduction 4+/5   Right Hip ADduction 4+/5   Left Hip Flexion 4-/5   Left Hip ABduction 4/5   Left Hip ADduction 4/5   Standardized Balance Assessment   10 Meter Walk 1.0 m/s without AD; community ambulator; improved from initial eval on 06/14/15 which was 0.8 m/s         TREAMENT: Quadruped: Prayer stretch straight 15 sec hold x2; Prayer stretch with hands and feet to left side 15 sec hold x2;  Left sidelying planks with pelvic lifts 5 sec hold x10;  Hooklying: Pball left lumbar trunk rotation and back to midline x1 min; Pball double knee to chest 5 sec hold x5;  Patient required min-moderate verbal/tactile cues for correct exercise technique.  Instructed patient in modified oswestry survey, 10 meter walk and assessed BLE strength to assess progress towards goals.   Finished with IFC (interferential) TENs to left lumbar paraspinals in sitting x20 min at tolerated intensity (#10.5) concurrent with moist heat.  Patient reports significant reduction in pain to 1/10 following TENs. Instructed patient to pay attention to how long she feels relief after TENs to assess long term benefits of treatment.  PT Education - 07/04/15 1702    Education provided Yes   Education Details ROM/stretches; progress towards goals   Person(s) Educated Patient   Methods Explanation;Verbal cues   Comprehension Verbalized understanding;Returned demonstration;Verbal cues required             PT Long Term Goals - 07/04/15 1631    PT LONG TERM GOAL #1   Title Patient will be independent with HEP to improve strength and improve ROM to allow increased ease with ADLs by 08/08/15    Time 8   Period Weeks   Status Partially Met   PT LONG TERM GOAL #2   Title Patient will reduce  self reported disability by reducing Modified ODI to <20% impaired to allow return to PLOF by 08/08/15   Time 8   Period Weeks   Status Not Met   PT LONG TERM GOAL #3   Title Patient will improve normal gait speed to >1.0 m/s to indicate reduced fall risk at home and in the community by 08/08/15   Time 8   Period Weeks   Status Partially Met   PT LONG TERM GOAL #4   Title Patient will increase LE strength to at least 4+/5 throughout bilateral LE to allow improve ease of access to the second story of home by 08/08/15   Time 8   Period Weeks   Status Partially Met   PT LONG TERM GOAL #5   Title Patient will decrease pain to 2/10 at worst to allow improve function with 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 She reports increased left hip pain with advanced stretches (pain in groin); PT assessed progress towards goals. Patient demonstrates improved gait speed, however tested as increased disability on Modified oswestry and continues to have weakness in LLE hip. PT has not been addressing hip weakness and patient would benefit from advanced exercise for hip.    Pt will benefit from skilled therapeutic intervention in order to improve on the following deficits Difficulty walking;Decreased range of motion;Decreased activity tolerance;Decreased strength;Decreased mobility;Decreased balance;Hypomobility;Increased fascial restricitons;Impaired flexibility;Postural dysfunction;Improper body mechanics;Pain;Abnormal gait   Rehab Potential Good   Clinical Impairments Affecting Rehab Potential Positive: recent onset of symptoms, minimal co-morbidities. Negative: severity of symptoms, progressive congition.    PT Frequency 2x / week   PT Duration 8 weeks   PT Treatment/Interventions ADLs/Self  Care Home Management;Electrical Stimulation;Cryotherapy;Moist Heat;Traction;Ultrasound;Gait training;Stair training;Functional mobility training;Therapeutic activities;Therapeutic exercise;Balance training;Neuromuscular re-education;Patient/family education;Manual techniques;Passive range of motion;Dry needling   PT Next Visit Plan  progress strengthening and soft tissue mobilization, TENs,   PT Home Exercise Plan continue as previously given.   Consulted and Agree with Plan of Care Patient        Problem List There are no active problems to display for this patient.   Rees Santistevan PT, DPT 07/04/2015, 5:07 PM  [place] MAIN [facility] SERVICES [address] [place], [place], [address] Phone: [phone]   Fax:  [phone]  Name: Cindy Patel MRN: [id number] Date of Birth: 1946/01/23

## 2015-07-06 ENCOUNTER — Ambulatory Visit: Payer: PPO | Admitting: Physical Therapy

## 2015-07-06 ENCOUNTER — Encounter: Payer: Self-pay | Admitting: Physical Therapy

## 2015-07-06 DIAGNOSIS — M5442 Lumbago with sciatica, left side: Secondary | ICD-10-CM | POA: Diagnosis not present

## 2015-07-06 DIAGNOSIS — R29898 Other symptoms and signs involving the musculoskeletal system: Secondary | ICD-10-CM

## 2015-07-06 DIAGNOSIS — M5386 Other specified dorsopathies, lumbar region: Secondary | ICD-10-CM

## 2015-07-06 NOTE — Therapy (Signed)
[place] MAIN [facility] SERVICES [address] [place], [place], [address] Phone: [phone]   Fax:  [phone]  Physical Therapy Treatment  Patient Details  Name: Cindy Patel MRN: [id number] Date of Birth: [birth year] Referring Provider: Erline Levine  Encounter Date: 07/06/2015      PT End of Session - 07/06/15 1048    Visit Number 7   Number of Visits 17   Date for PT Re-Evaluation 08/08/15   Authorization Type Gcode 2   Authorization Time Period 10    PT Start Time 1032   PT Stop Time 1115   PT Time Calculation (min) 43 min   Activity Tolerance Patient tolerated treatment well   Behavior During Therapy [facility] for tasks assessed/performed      History reviewed. No pertinent past medical history.  History reviewed. No pertinent past surgical history.  There were no vitals filed for this visit.  Visit Diagnosis:  Left-sided low back pain with left-sided sciatica  Decreased ROM of lumbar spine  Leg weakness, bilateral      Subjective Assessment - 07/06/15 1256    Subjective Patient reports less LLE pain and back pain today. She reports going to see MD yesterday who recommended a steriod injection. She isn't sure about the injection at this time. Patient reports still having trouble with walking longer distances (>15 min) and increased pain with riding in car >15 min;    Pertinent History Hx of Uterine Cancer 10 years ago. Gall bladder removal 15 years ago.     Limitations Sitting;Standing;Walking   How long can you stand comfortably? 30 minutes    How long can you walk comfortably? less than 5 minutes.    Diagnostic tests MRI: L convex Scoliosis. mild foraminal narrowing L2-L5.    Patient Stated Goals eliminate pain. Be able to do exercises at home.    Currently in Pain? Yes   Pain Score 1    Pain Location Back   Pain Orientation Lower;Left   Pain Descriptors / Indicators Sore   Pain Onset More than a month ago        TREATMENT: PT assessed patient's symptoms and reviewed information that she obtained from MD visit yesterday; Instructed patient in lumbar flexion stretch: Qped prayer stretch 20 sec hold x2; Qped prayer stretch with hands/feet to left 20 sec hold x2; Educated patient on how flexion exercise is helping reduce pressure along nerve roots in lumbar spine;  Following exercise: PT performed gentle manual lumbosacral distraction in hooklying 10 sec hold, x10 sec rest x10 min assessing symptoms during manual distraction. Patient reports no LLE pain while in distraction but then return of symptoms upon standing and walking 100 feet (pain down calf to foot) 3/10 pain;  Following manual therapy: PT applied lumbosacral mechanical distraction, 45 # pull, 30# rest, 15 sec hold, 10 sec rest x10 min in hooklying position; Upon standing and moving after traction patient denies any back of LE pain;                           PT Education - 07/06/15 1554    Education provided Yes   Education Details traction, posture   Person(s) Educated Patient   Methods Explanation;Verbal cues   Comprehension Returned demonstration;Verbalized understanding;Verbal cues required             PT Long Term Goals - 07/04/15 1631    PT LONG TERM GOAL #1   Title Patient  will be independent with HEP to improve strength and improve ROM to allow increased ease with ADLs by 08/08/15    Time 8   Period Weeks   Status Partially Met   PT LONG TERM GOAL #2   Title Patient will reduce self reported disability by reducing Modified ODI to <20% impaired to allow return to PLOF by 08/08/15   Time 8   Period Weeks   Status Not Met   PT LONG TERM GOAL #3   Title Patient will improve normal gait speed to >1.0 m/s to indicate reduced fall risk at home and in the community by 08/08/15   Time 8   Period Weeks   Status Partially Met   PT LONG TERM GOAL #4   Title Patient will increase LE strength to at  least 4+/5 throughout bilateral LE to allow improve ease of access to the second story of home by 08/08/15   Time 8   Period Weeks   Status Partially Met   PT LONG TERM GOAL #5   Title Patient will decrease pain to 2/10 at worst to allow improve function with 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 Patient continues to have radicular symptoms in LLE. PT instructed patient in lumbar flexion stretches with less back pain. PT also performed manual lumbosacral distraction in hooklying. Patient responded well with less LLE pain. PT applied lumbar mechnical traction in hooklying with 45 # pull x10 min; Patient reports no back or LLE pain following treatment session; She would benefit from additional skilled PT intervention to reduce back pain and improve tolerance with standing/walking tasks.    Pt will benefit from skilled therapeutic intervention in order to improve on the following deficits Difficulty walking;Decreased range of motion;Decreased activity tolerance;Decreased strength;Decreased mobility;Decreased balance;Hypomobility;Increased fascial restricitons;Impaired flexibility;Postural dysfunction;Improper body mechanics;Pain;Abnormal gait   Rehab Potential Good   Clinical Impairments Affecting Rehab Potential Positive: recent onset of symptoms, minimal co-morbidities. Negative: severity of symptoms, progressive congition.    PT Frequency 2x / week   PT Duration 8 weeks   PT Treatment/Interventions ADLs/Self Care Home Management;Electrical Stimulation;Cryotherapy;Moist Heat;Traction;Ultrasound;Gait training;Stair training;Functional mobility training;Therapeutic activities;Therapeutic exercise;Balance training;Neuromuscular re-education;Patient/family  education;Manual techniques;Passive range of motion;Dry needling   PT Next Visit Plan Check effect of traction;    PT Home Exercise Plan continue as previously given.   Consulted and Agree with Plan of Care Patient        Problem List There are no active problems to display for this patient.   Rhondalyn Clingan PT, DPT 07/06/2015, 4:00 PM  [place] MAIN [facility] SERVICES [address] [place], [place], [address] Phone: [phone]   Fax:  [phone]  Name: Cindy Patel MRN: [id number] Date of Birth: March 25, 1946

## 2015-07-10 ENCOUNTER — Encounter: Payer: Self-pay | Admitting: Physical Therapy

## 2015-07-10 ENCOUNTER — Ambulatory Visit: Payer: PPO | Admitting: Physical Therapy

## 2015-07-10 DIAGNOSIS — M5386 Other specified dorsopathies, lumbar region: Secondary | ICD-10-CM

## 2015-07-10 DIAGNOSIS — M5442 Lumbago with sciatica, left side: Secondary | ICD-10-CM

## 2015-07-10 DIAGNOSIS — R29898 Other symptoms and signs involving the musculoskeletal system: Secondary | ICD-10-CM

## 2015-07-10 NOTE — Therapy (Signed)
[place] MAIN [facility] SERVICES [address] [place], [place], [address] Phone: [phone]   Fax:  [phone]  Physical Therapy Treatment  Patient Details  Name: Cindy Patel MRN: [id number] Date of Birth: 03-Mar-1946 Referring Provider: Erline Levine  Encounter Date: 07/10/2015      PT End of Session - 07/10/15 1522    Visit Number 8   Number of Visits 17   Date for PT Re-Evaluation 08/08/15   Authorization Type Gcode 3   Authorization Time Period 10    PT Start Time 1430   PT Stop Time 1520   PT Time Calculation (min) 50 min   Activity Tolerance Patient tolerated treatment well;No increased pain   Behavior During Therapy [facility] for tasks assessed/performed      History reviewed. No pertinent past medical history.  History reviewed. No pertinent past surgical history.  There were no vitals filed for this visit.  Visit Diagnosis:  Left-sided low back pain with left-sided sciatica  Decreased ROM of lumbar spine  Leg weakness, bilateral      Subjective Assessment - 07/10/15 1438    Subjective Patient reports being able to do a lot of shopping after last visit. She did have back/LLE pain that returned by the time she got to her car, but her pain wasn't severe. She isn't sure if the traction helped or not. Patient reports a 5/10 back/LLE pain. However the pain is only middle calf to foot;    Pertinent History Hx of Uterine Cancer 10 years ago. Gall bladder removal 15 years ago.     Limitations Sitting;Standing;Walking   How long can you stand comfortably? 30 minutes    How long can you walk comfortably? less than 5 minutes.    Diagnostic tests MRI: L convex Scoliosis. mild foraminal narrowing L2-L5.    Patient Stated Goals eliminate pain. Be able to do exercises at home.    Currently in Pain? Yes   Pain Score 5    Pain Location Back   Pain Orientation Left;Lower   Pain Descriptors / Indicators Sore   Pain Type Chronic pain    Pain Radiating Towards LLE middle calf into foot;    Pain Onset More than a month ago   Pain Frequency Intermittent          TREATMENT Qped: Prayer stretch 10 sec hold x3; Prayer stretch with hands/feet to the right 10 sec hold x3;  Hooklying: Lumbar trunk rotation to right x1 min; Posterior pelvic tilt 5 sec hold x10; Posterior pelvic tilt with BLE lift at 90/90 5 sec hold x5; LLE SLR flexion x10 with min VCs to slow down eccentric lower for strengthening;  Sidelying: LLE hip abduction SLR x10; LLE hip abduction clamshells 2x10;  Prone: Hip extension x10 bilaterally; with min Vcs to avoid pelvic rotation; Glute max hip extension LLE only x10;  Patient required min-moderate verbal/tactile cues for correct exercise technique including cues to slow down LE movement for better strengthening.   Finished with moist heat to low back concurrent with Interferential TENs to left lower back x15 min at tolerated intensity; Patient reports no pain after treatment session;                        PT Education - 07/10/15 1522    Education provided Yes   Education Details new exercise, HEP   Person(s) Educated Patient   Methods Explanation;Verbal cues;Handout   Comprehension Verbalized understanding;Returned demonstration;Verbal cues required  PT Long Term Goals - 07/04/15 1631    PT LONG TERM GOAL #1   Title Patient will be independent with HEP to improve strength and improve ROM to allow increased ease with ADLs by 08/08/15    Time 8   Period Weeks   Status Partially Met   PT LONG TERM GOAL #2   Title Patient will reduce self reported disability by reducing Modified ODI to <20% impaired to allow return to PLOF by 08/08/15   Time 8   Period Weeks   Status Not Met   PT LONG TERM GOAL #3   Title Patient will improve normal gait speed to >1.0 m/s to indicate reduced fall risk at home and in the community by 08/08/15   Time 8   Period Weeks    Status Partially Met   PT LONG TERM GOAL #4   Title Patient will increase LE strength to at least 4+/5 throughout bilateral LE to allow improve ease of access to the second story of home by 08/08/15   Time 8   Period Weeks   Status Partially Met   PT LONG TERM GOAL #5   Title Patient will decrease pain to 2/10 at worst to allow improve function with 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 She required min Vcs for correct exercise technique including to slow down LE movement for better strengthening. patient responded well to exercise with no pain reported after exercise. Patient instructed in gait aroung gym and within 2 min started having LLE radiculopathy. Finished with Moist heat and Interferential TENs to left hip in sitting x15 min; Patient reports no pain after treatment session. She would benefit from additional skilled PT intervention to improve LE strength, balance and gait safety.    Pt will benefit from skilled therapeutic intervention in order to improve on the following deficits Difficulty walking;Decreased range of motion;Decreased activity tolerance;Decreased strength;Decreased mobility;Decreased balance;Hypomobility;Increased fascial restricitons;Impaired flexibility;Postural dysfunction;Improper body mechanics;Pain;Abnormal gait   Rehab Potential Good   Clinical Impairments Affecting Rehab Potential Positive: recent onset of symptoms, minimal co-morbidities. Negative: severity of symptoms, progressive congition.    PT Frequency 2x / week   PT Duration 8 weeks   PT Treatment/Interventions ADLs/Self Care Home Management;Electrical Stimulation;Cryotherapy;Moist Heat;Traction;Ultrasound;Gait training;Stair training;Functional mobility training;Therapeutic  activities;Therapeutic exercise;Balance training;Neuromuscular re-education;Patient/family education;Manual techniques;Passive range of motion;Dry needling   PT Next Visit Plan Check effect of traction;    PT Home Exercise Plan advanced see patient instructions   Consulted and Agree with Plan of Care Patient        Problem List There are no active problems to display for this patient.   Merlinda Wrubel PT, DPT 07/10/2015, 3:24 PM  [place] MAIN [facility] SERVICES [address] [place], [place], [address] Phone: [phone]   Fax:  [phone]  Name: Cindy Patel MRN: [id number] Date of Birth: Apr 03, 1946

## 2015-07-10 NOTE — Patient Instructions (Addendum)
Hip Extension (Prone)  Lying on your stomach (you can put a pillow under your stomach for comfort if needed) Be sure to keep stomach tight Alternate lifting each leg _1-2___ inches from bed/ floor, keeping knee straight. Repeat _10___ times per set. Do _1-2___ sets per session. Do __1__ sessions per day.  http://orth.exer.us/98   Abduction: Side Leg Lift (Eccentric) - Side-Lying   Lie on side. Lift top leg slightly higher than shoulder level. Keep top leg straight with body, toes pointing forward. Slowly lower for 3-5 seconds. _10__ reps per set, _1__ sets per day, __5_ days per week. Be sure to keep stomach tight and avoid rotating spine  Copyright  VHI. All rights reserved.  Strengthening: Straight Leg Raise (Phase 1)  Lying on back with one knee bent and one knee straight Tighten muscles on front of right thigh, then lift leg _3-5___ inches from surface, keeping knee locked.  Repeat __10__ times per set. Do __1__ sets per session. Do __1__ sessions per day.  http://orth.exer.us/614   Copyright  VHI. All rights reserved.   Abduction: Clam (Eccentric) - Side-Lying   Lie on side with knees bent. Lift top knee, keeping feet together. Keep trunk steady. Slowly lower for 3-5 seconds. __10_ reps per set, __2_ sets per day, _5__ days per week. Tie      Green      Band around both knees for resistance.  Copyright  VHI. All rights reserved.

## 2015-07-12 ENCOUNTER — Ambulatory Visit: Payer: PPO | Admitting: Physical Therapy

## 2015-07-13 ENCOUNTER — Ambulatory Visit: Payer: PPO | Admitting: Physical Therapy

## 2015-07-13 ENCOUNTER — Encounter: Payer: Self-pay | Admitting: Physical Therapy

## 2015-07-13 DIAGNOSIS — M5386 Other specified dorsopathies, lumbar region: Secondary | ICD-10-CM

## 2015-07-13 DIAGNOSIS — M5442 Lumbago with sciatica, left side: Secondary | ICD-10-CM | POA: Diagnosis not present

## 2015-07-13 DIAGNOSIS — R29898 Other symptoms and signs involving the musculoskeletal system: Secondary | ICD-10-CM

## 2015-07-13 NOTE — Therapy (Signed)
[place] MAIN [facility] SERVICES [address] [place], [place], [address] Phone: [phone]   Fax:  [phone]  Physical Therapy Treatment  Patient Details  Name: Cindy Patel MRN: [id number] Date of Birth: Jul 23, 1946 Referring Provider: Erline Levine  Encounter Date: 07/13/2015      PT End of Session - 07/13/15 1519    Visit Number 9   Number of Visits 17   Date for PT Re-Evaluation 08/08/15   Authorization Type Gcode 4   Authorization Time Period 10    PT Start Time 8182   PT Stop Time 9937   PT Time Calculation (min) 45 min   Activity Tolerance Patient tolerated treatment well;No increased pain   Behavior During Therapy [facility] for tasks assessed/performed      History reviewed. No pertinent past medical history.  History reviewed. No pertinent past surgical history.  There were no vitals filed for this visit.  Visit Diagnosis:  Left-sided low back pain with left-sided sciatica  Decreased ROM of lumbar spine  Leg weakness, bilateral      Subjective Assessment - 07/13/15 1446    Subjective Patient reports no pain after TENs last time, but then started feeling more hip pain and back pain by the time she got to her car; She reports feeling well today; She reports compliance with HEP;    Pertinent History Hx of Uterine Cancer 10 years ago. Gall bladder removal 15 years ago.     Limitations Sitting;Standing;Walking   How long can you stand comfortably? 30 minutes    How long can you walk comfortably? less than 5 minutes.    Diagnostic tests MRI: L convex Scoliosis. mild foraminal narrowing L2-L5.    Patient Stated Goals eliminate pain. Be able to do exercises at home.    Currently in Pain? Yes   Pain Score 2    Pain Location Leg   Pain Orientation Left;Lower   Pain Descriptors / Indicators Sore   Pain Type Chronic pain   Pain Radiating Towards LLE anterior lower leg;    Pain Onset More than a month ago        TREATMENT: Hooklying: Pball lumbar trunk rotation to right only x2 min; Pball double knee to chest 10 sec hold x5; Posterior pelvic tilt with single suspended heel slides x5 each, with BLE suspended heel slides 2x5 with min VCS for increased core stabilization; LLE SLR flexion x10 with min VCs to slow down eccentric lower for strengthening;   Qped: Prayer stretch 10 sec hold x3; Prayer stretch with hands/feet to the right 10 sec hold x3; LLE hip extension x10 with min VCs to avoid trunk rotation and to increase core abdominal stabilization;   Sidelying: LE hip abduction clamshells green tband, 2x10, bilaterally with min VCs to avoid trunk rotation and slow down eccentric return for better strengthening;  Prone: Hip extension x10 bilaterally; with min Vcs to avoid pelvic rotation;  Patient required min-moderate verbal/tactile cues for correct exercise technique including cues to slow down LE movement for better strengthening.  Quantum leg press: BLE 60# x10, LLE only 30# x10 with min VCs for positioning and to avoid locking knee;                              PT Education - 07/13/15 1519    Education provided Yes   Education Details core stabilization, hip strengthening   Person(s) Educated Patient   Methods Explanation;Verbal  cues   Comprehension Verbalized understanding;Returned demonstration;Verbal cues required             PT Long Term Goals - 07/04/15 1631    PT LONG TERM GOAL #1   Title Patient will be independent with HEP to improve strength and improve ROM to allow increased ease with ADLs by 08/08/15    Time 8   Period Weeks   Status Partially Met   PT LONG TERM GOAL #2   Title Patient will reduce self reported disability by reducing Modified ODI to <20% impaired to allow return to PLOF by 08/08/15   Time 8   Period Weeks   Status Not Met   PT LONG TERM GOAL #3   Title Patient will improve normal gait speed to >1.0 m/s to indicate  reduced fall risk at home and in the community by 08/08/15   Time 8   Period Weeks   Status Partially Met   PT LONG TERM GOAL #4   Title Patient will increase LE strength to at least 4+/5 throughout bilateral LE to allow improve ease of access to the second story of home by 08/08/15   Time 8   Period Weeks   Status Partially Met   PT LONG TERM GOAL #5   Title Patient will decrease pain to 2/10 at worst to allow 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 Patient required min VCs for correct exercise technique including to increase core stabilization during LE movement. Patient reports no increase in pain with advanced exercise. She would benefit from additional skilled PT intervention to improve LE strength and improve core strength for less back/LLE pain.    Pt will benefit from skilled therapeutic intervention in order to improve on the following deficits Difficulty walking;Decreased range of motion;Decreased activity tolerance;Decreased strength;Decreased mobility;Decreased balance;Hypomobility;Increased fascial restricitons;Impaired flexibility;Postural dysfunction;Improper body mechanics;Pain;Abnormal gait   Rehab Potential Good   Clinical Impairments Affecting Rehab Potential Positive: recent onset of symptoms, minimal co-morbidities. Negative: severity of symptoms, progressive congition.    PT Frequency 2x / week   PT Duration 8 weeks   PT Treatment/Interventions ADLs/Self Care Home Management;Electrical Stimulation;Cryotherapy;Moist Heat;Traction;Ultrasound;Gait training;Stair training;Functional mobility training;Therapeutic activities;Therapeutic exercise;Balance training;Neuromuscular re-education;Patient/family education;Manual techniques;Passive range of  motion;Dry needling   PT Next Visit Plan LE strengthening, core stabilization   PT Home Exercise Plan continue as previously given   Consulted and Agree with Plan of Care Patient        Problem List There are no active problems to display for this patient.     Trotter,Margaret PT, DPT 07/13/2015, 4:11 PM  [place] MAIN [facility] SERVICES [address] [place], [place], [address] Phone: [phone]   Fax:  [phone]  Name: Cindy Patel MRN: [id number] Date of Birth: Dec 26, 1945

## 2015-07-17 ENCOUNTER — Ambulatory Visit: Payer: PPO

## 2015-07-17 VITALS — BP 148/58 | HR 70

## 2015-07-17 DIAGNOSIS — M5442 Lumbago with sciatica, left side: Secondary | ICD-10-CM

## 2015-07-17 DIAGNOSIS — R29898 Other symptoms and signs involving the musculoskeletal system: Secondary | ICD-10-CM

## 2015-07-17 NOTE — Therapy (Signed)
[place] MAIN [facility] SERVICES [address] [place], [place], [address] Phone: [phone]   Fax:  [phone]  Physical Therapy Treatment  Patient Details  Name: Cindy Patel MRN: [id number] Date of Birth: May 16, 1946 Referring Provider: Erline Levine  Encounter Date: 07/17/2015      PT End of Session - 07/17/15 1543    Visit Number 10   Number of Visits 17   Date for PT Re-Evaluation 08/08/15   Authorization Type Gcode 5   Authorization Time Period 10    PT Start Time 1440   PT Stop Time 1523   PT Time Calculation (min) 43 min   Activity Tolerance Patient tolerated treatment well;No increased pain   Behavior During Therapy [facility] for tasks assessed/performed      No past medical history on file.  No past surgical history on file.  Filed Vitals:   07/17/15 1443  BP: 148/58  Pulse: 70  SpO2: 100%    Visit Diagnosis:  Left-sided low back pain with left-sided sciatica  Leg weakness, bilateral      Subjective Assessment - 07/17/15 1439    Subjective Pt states she is doing well on this date. She has "a little pain" but is much improved since starting therapy. Pt is compliant with HEP and has no specific questions or concerns at this time.    Pertinent History Hx of Uterine Cancer 10 years ago. Gall bladder removal 15 years ago.     Limitations Sitting;Standing;Walking   How long can you stand comfortably? 30 minutes    How long can you walk comfortably? less than 5 minutes.    Diagnostic tests MRI: L convex Scoliosis. mild foraminal narrowing L2-L5.    Patient Stated Goals eliminate pain. Be able to do exercises at home.    Currently in Pain? Yes   Pain Score 1    Pain Location Back   Pain Orientation Lower   Pain Type Chronic pain   Pain Onset More than a month ago   Multiple Pain Sites No        TREATMENT:  Pball lumbar trunk rotation bilateral x 2 min; Pball double knee to chest x 10 follwed by 10 sec hold x  5; L hip IR and ER stretch 20 second hold each; Double knee to chest stretch 20 second hold x 2; Hooklying posterior pelvic tilt with coordinated slow exhale 5 second hold x 10; Hooklying posterior pelvic tilt with coordinated slow exhale and single suspended heel slides x 5 each, for increased core stabilization;  Qped: Prayer stretch 10 sec hold x3; Prayer stretch with hands/feet to the right 10 sec hold x3; Fire hydrants x 10 LLE only, attempted RLE but increase in R groin pain so discontinued;  Prone: Hip extension x10 bilaterally; with min Vcs to avoid pelvic rotation;  Sidelying: R sidelying LLE clamshells green tband, 2 x 10, with min VCs to avoid trunk rotation  Patient required min-moderate verbal/tactile cues throughout session for correct exercise technique;                          PT Education - 07/17/15 1542    Education provided Yes   Education Details Reinforced HEP, importance of core stabilization, deep abdominal muscles and exercises   Person(s) Educated Patient   Methods Explanation;Verbal cues   Comprehension Verbalized understanding;Returned demonstration             PT Long Term Goals - 07/04/15  [place] #1   Title Patient will be independent with HEP to improve strength and improve ROM to allow increased ease with ADLs by 08/08/15    Time 8   Period Weeks   Status Partially Met   PT LONG TERM GOAL #2   Title Patient will reduce self reported disability by reducing Modified ODI to <20% impaired to allow return to PLOF by 08/08/15   Time 8   Period Weeks   Status Not Met   PT LONG TERM GOAL #3   Title Patient will improve normal gait speed to >1.0 m/s to indicate reduced fall risk at home and in the community by 08/08/15   Time 8   Period Weeks   Status Partially Met   PT LONG TERM GOAL #4   Title Patient will increase LE strength to at least 4+/5 throughout bilateral LE to allow improve ease of access to the  second story of home by 08/08/15   Time 8   Period Weeks   Status Partially Met   PT LONG TERM GOAL #5   Title Patient will decrease pain to 2/10 at worst to allow improve function with 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 Pt demonstrates good technique with exercises integrating cues provided by therapist to minimize discomfort. Pt is very attentive but does get occasionally distracted with tangental conversation. Pt encouraged to continue HEP and follow-up as scheduled.    Pt will benefit from skilled therapeutic intervention in order to improve on the following deficits Difficulty walking;Decreased range of motion;Decreased activity tolerance;Decreased strength;Decreased mobility;Decreased balance;Hypomobility;Increased fascial restricitons;Impaired flexibility;Postural dysfunction;Improper body mechanics;Pain;Abnormal gait   Rehab Potential Good   Clinical Impairments Affecting Rehab Potential Positive: recent onset of symptoms, minimal co-morbidities. Negative: severity of symptoms, progressive congition.    PT Frequency 2x / week   PT Duration 8 weeks   PT Treatment/Interventions ADLs/Self Care Home Management;Electrical Stimulation;Cryotherapy;Moist Heat;Traction;Ultrasound;Gait training;Stair training;Functional mobility training;Therapeutic activities;Therapeutic exercise;Balance training;Neuromuscular re-education;Patient/family education;Manual techniques;Passive range of motion;Dry needling   PT Next Visit Plan Progress LE strengthening, core stabilization   PT Home Exercise Plan continue as previously given   Consulted and Agree with Plan of Care Patient        Problem List There are no active  problems to display for this patient.  Phillips Grout PT, DPT   Sabrea Sankey 07/17/2015, 3:46 PM  [place] MAIN [facility] SERVICES [address] [place], [place], [address] Phone: [phone]   Fax:  [phone]  Name: MERRILLYN ACKERLEY MRN: [id number] Date of Birth: [birth year]

## 2015-07-18 ENCOUNTER — Encounter: Payer: PPO | Admitting: Physical Therapy

## 2015-07-19 ENCOUNTER — Ambulatory Visit: Payer: PPO

## 2015-07-19 VITALS — BP 135/57 | HR 73

## 2015-07-19 DIAGNOSIS — M5442 Lumbago with sciatica, left side: Secondary | ICD-10-CM | POA: Diagnosis not present

## 2015-07-19 DIAGNOSIS — R29898 Other symptoms and signs involving the musculoskeletal system: Secondary | ICD-10-CM

## 2015-07-19 DIAGNOSIS — M5386 Other specified dorsopathies, lumbar region: Secondary | ICD-10-CM

## 2015-07-19 NOTE — Therapy (Signed)
[place] MAIN [facility] SERVICES [address] [place], [place], [address] Phone: [phone]   Fax:  [phone]  Physical Therapy Treatment  Patient Details  Name: KATALINA MAGRI MRN: [id number] Date of Birth: 03-29-1946 Referring Provider: Erline Levine  Encounter Date: 07/19/2015      PT End of Session - 07/19/15 1553    Visit Number 11   Number of Visits 17   Date for PT Re-Evaluation 08/08/15   Authorization Type Gcode 6   Authorization Time Period 10    PT Start Time 1440   PT Stop Time 1523   PT Time Calculation (min) 43 min   Activity Tolerance Patient tolerated treatment well;No increased pain   Behavior During Therapy [facility] for tasks assessed/performed      History reviewed. No pertinent past medical history.  History reviewed. No pertinent past surgical history.  Filed Vitals:   07/19/15 1442  BP: 135/57  Pulse: 73  SpO2: 99%    Visit Diagnosis:  Left-sided low back pain with left-sided sciatica  Leg weakness, bilateral  Decreased ROM of lumbar spine      Subjective Assessment - 07/19/15 1443    Subjective Pt reports increased back pain today. Rates pain in standing 8-9/10 and in sitting 5/10. HEP is going well. No specific questions or concerns at this time. Pt denies increased soreness after last therapy session or yesterday.    Pertinent History Hx of Uterine Cancer 10 years ago. Gall bladder removal 15 years ago.     Limitations Sitting;Standing;Walking   How long can you stand comfortably? 30 minutes    How long can you walk comfortably? less than 5 minutes.    Diagnostic tests MRI: L convex Scoliosis. mild foraminal narrowing L2-L5.    Patient Stated Goals eliminate pain. Be able to do exercises at home.    Currently in Pain? Yes   Pain Score 9    Pain Location Back   Pain Orientation Lower   Pain Descriptors / Indicators Sore   Pain Type Chronic pain   Pain Onset More than a month ago   Aggravating  Factors  walking, standing   Pain Relieving Factors sitting and lying down   Multiple Pain Sites No       TREATMENT:  Pball lumbar trunk rotation bilateral x 2 min; Pball double knee to chest x 10 follwed by 10 sec hold x 5; Pball adductor squeeze with isometric holds at 80 degrees hip flexion followed by small oscillations toward table for pelvic stabilization; Bilateral hip IR and ER stretch 20 second hold each; Double knee to chest stretch 20 second hold x 2; Attempted bridges but pt unable to perform without low back pain, attempted to increase lumbar flexion during bridge but no improvement realized; Hooklying posterior pelvic tilt with coordinated slow exhale and single suspended heel slides x 5 each, for increased core stabilization;  Qped: Prayer stretch 10 sec hold x3; Bird dogs x 10; Fire hydrants x 10 bilateral LE, no groin pain reported today;  Prone: Hip extension x 10 bilaterally with mild bend in knee to minimize discomfort; with min Vcs to avoid pelvic rotation; Pt initially with discomfort but following hip mobs pain is abolished.   Sidelying: R sidelying knee bridges x 10; R sidelying L hip SLR abduction x 10; R sidelying LLE clamshells with manual resistance, with min VCs and tactile support to avoid trunk rotation  Patient required min-moderate verbal/tactile cues throughout session for correct exercise technique  PT Education - 07/19/15 1553    Education provided Yes   Education Details HEP reinforced   Person(s) Educated Patient   Methods Explanation;Verbal cues   Comprehension Verbalized understanding;Returned demonstration             PT Long Term Goals - 07/04/15 1631    PT LONG TERM GOAL #1   Title Patient will be independent with HEP to improve strength and improve ROM to allow increased ease with ADLs by 08/08/15    Time 8   Period Weeks   Status Partially Met   PT LONG TERM GOAL #2   Title  Patient will reduce self reported disability by reducing Modified ODI to <20% impaired to allow return to PLOF by 08/08/15   Time 8   Period Weeks   Status Not Met   PT LONG TERM GOAL #3   Title Patient will improve normal gait speed to >1.0 m/s to indicate reduced fall risk at home and in the community by 08/08/15   Time 8   Period Weeks   Status Partially Met   PT LONG TERM GOAL #4   Title Patient will increase LE strength to at least 4+/5 throughout bilateral LE to allow improve ease of access to the second story of home by 08/08/15   Time 8   Period Weeks   Status Partially Met   PT LONG TERM GOAL #5   Title Patient will decrease pain to 2/10 at worst to allow improve function with 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 She has tight quadricep muscles and lacks bilateral hip extension. Pt intially reports anterior thigh pain with active prone hip extension. Following PA mobilizations pain is abolished. Pt encouraged to continue HEP and follow-up as scheduled.    Pt will benefit from skilled therapeutic intervention in order to improve on the following deficits Difficulty walking;Decreased range of motion;Decreased activity tolerance;Decreased strength;Decreased mobility;Decreased balance;Hypomobility;Increased fascial restricitons;Impaired flexibility;Postural dysfunction;Improper body mechanics;Pain;Abnormal gait   Rehab Potential Good   Clinical Impairments Affecting Rehab Potential Positive: recent onset of symptoms, minimal co-morbidities. Negative: severity of symptoms, progressive congition.    PT Frequency 2x / week   PT Duration 8 weeks   PT Treatment/Interventions ADLs/Self Care Home Management;Electrical Stimulation;Cryotherapy;Moist Heat;Traction;Ultrasound;Gait training;Stair  training;Functional mobility training;Therapeutic activities;Therapeutic exercise;Balance training;Neuromuscular re-education;Patient/family education;Manual techniques;Passive range of motion;Dry needling   PT Next Visit Plan Progress LE strengthening, core stabilization   PT Home Exercise Plan continue as previously given   Consulted and Agree with Plan of Care Patient        Problem List There are no active problems to display for this patient.  Phillips Grout PT, DPT   Huprich,Jason 07/19/2015, 3:59 PM  [place] MAIN [facility] SERVICES [address] [place], [place], [address] Phone: [phone]   Fax:  [phone]  Name: REGANNE MESSERSCHMIDT MRN: [id number] Date of Birth: 12-20-1945

## 2015-07-28 NOTE — Addendum Note (Signed)
Addended by: Blanche East E on: 07/28/2015 11:58 AM   Modules accepted: Orders

## 2015-08-01 ENCOUNTER — Encounter: Payer: Self-pay | Admitting: Physical Therapy

## 2015-08-01 ENCOUNTER — Ambulatory Visit: Payer: PPO | Attending: Neurosurgery | Admitting: Physical Therapy

## 2015-08-01 DIAGNOSIS — M5442 Lumbago with sciatica, left side: Secondary | ICD-10-CM | POA: Diagnosis not present

## 2015-08-01 DIAGNOSIS — R29898 Other symptoms and signs involving the musculoskeletal system: Secondary | ICD-10-CM | POA: Diagnosis not present

## 2015-08-01 DIAGNOSIS — M2560 Stiffness of unspecified joint, not elsewhere classified: Secondary | ICD-10-CM | POA: Diagnosis not present

## 2015-08-01 DIAGNOSIS — M5386 Other specified dorsopathies, lumbar region: Secondary | ICD-10-CM

## 2015-08-01 NOTE — Patient Instructions (Signed)
Balance, Proprioception: Hip Abduction With Tubing   With tubing attached to both ankles, Standing holding onto counter, kick one leg out to side and then Return.  Repeat _10___ times  On each side.  Do ___2_ sessions per day.  http://cc.exer.us/20     Copyright  VHI. All rights reserved.  Balance, Proprioception: Hip Extension With Tubing   With tubing tied around both legs, holding onto kitchen counter, swing leg back. Return. Repeat _10___ times . Do __2__ sessions per day.  http://cc.exer.us/19     Copyright  VHI. All rights reserved.  Band Walk: Side Stepping   Tie band around legs, AROUND ANKLES,  Step _10__ feet to one side, then step back to start. Repeat _2-3__ feet per session. Note: Small towel between band and skin eases rubbing.  [email or web address].[email or web address]

## 2015-08-02 DIAGNOSIS — M5442 Lumbago with sciatica, left side: Secondary | ICD-10-CM | POA: Diagnosis not present

## 2015-08-02 NOTE — Therapy (Signed)
[place] MAIN [facility] SERVICES [address] [place], [place], [address] Phone: [phone]   Fax:  [phone]  Physical Therapy Treatment/Discharge Summary  Patient Details  Name: Cindy Patel MRN: [id number] Date of Birth: 09-13-1945 Referring Provider: Erline Levine  Encounter Date: 08/01/2015      PT End of Session - 08/02/15 1243    Visit Number 12   Number of Visits 17   Date for PT Re-Evaluation 08/08/15   Authorization Type Gcode 6   Authorization Time Period 10    PT Start Time 1650   PT Stop Time 1730   PT Time Calculation (min) 40 min   Activity Tolerance Patient tolerated treatment well   Behavior During Therapy [facility] for tasks assessed/performed      History reviewed. No pertinent past medical history.  History reviewed. No pertinent past surgical history.  There were no vitals filed for this visit.  Visit Diagnosis:  Left-sided low back pain with left-sided sciatica  Leg weakness, bilateral  Decreased ROM of lumbar spine      Subjective Assessment - 08/01/15 1654    Subjective Patient reports doing well after injection; She is feeling a 3/10 left hip pain with the worst pain of 3/10; She reports compliance with HEP    Pertinent History Hx of Uterine Cancer 10 years ago. Gall bladder removal 15 years ago.     Limitations Sitting;Standing;Walking   How long can you stand comfortably? 30 minutes    How long can you walk comfortably? less than 5 minutes.    Diagnostic tests MRI: L convex Scoliosis. mild foraminal narrowing L2-L5.    Patient Stated Goals eliminate pain. Be able to do exercises at home.    Currently in Pain? Yes   Pain Score 3    Pain Location Hip   Pain Orientation Left;Posterior   Pain Descriptors / Indicators Sore   Pain Onset More than a month ago   Pain Frequency Intermittent            [facility] PT Assessment - 08/02/15 0001    Strength   Right Hip Flexion 4+/5   Right Hip ABduction  4+/5   Right Hip ADduction 4+/5   Left Hip Flexion 4+/5   Left Hip ABduction 4+/5   Left Hip ADduction 4+/5   Standardized Balance Assessment   10 Meter Walk 1.28 m/s without AD; community ambulator; improved from initial eval on 06/13/15 which was 0.8 m/s      TREATMENT PT educated patient in HEP  Advanced: Standing green tband around both legs: Hip abduction 2x10 bilaterally; Hip extension x10 bilaterally; Side stepping 10 feet x3 laps; Patient required mod VCs to avoid trunk lean during standing hip exercise and to slow down LE movement for increased tolerance of exercise;  Qped: childs pose 15 sec hold x3;  Sidelying: Side plank on feet 5 sec hold x3 reps; Educated patient on ways to advance side plank, progressing from elbow to hand based on tolerance;  PT assessed goals. Patient reports worst pain over past few days of 2-3/10 with no pain in last week with compliance with HEP;  PT reinforced HEP with instruction to do some in the morning and some in the evening starting with stretches and progressing to strengthening exercise. Patient verbalized understanding.                       PT Education - 08/02/15 1242    Education provided Yes  Education Details HEP advanced see patient instructions, plan of care   Person(s) Educated Patient   Methods Explanation;Verbal cues;Handout   Comprehension Returned demonstration;Verbal cues required;Verbalized understanding             PT Long Term Goals - 08/01/15 1703    PT LONG TERM GOAL #1   Title Patient will be independent with HEP to improve strength and improve ROM to allow increased ease with ADLs by 08/08/15    Time 8   Period Weeks   Status Achieved   PT LONG TERM GOAL #2   Title Patient will reduce self reported disability by reducing Modified ODI to <20% impaired to allow return to PLOF by 08/08/15   Time 8   Period Weeks   Status Partially Met   PT LONG TERM GOAL #3   Title Patient will  improve normal gait speed to >1.0 m/s to indicate reduced fall risk at home and in the community by 08/08/15   Baseline 7 sec, 10 sec   Time 8   Period Weeks   Status Achieved   PT LONG TERM GOAL #4   Title Patient will increase LE strength to at least 4+/5 throughout bilateral LE to allow improve ease of access to the second story of home by 08/08/15   Time 8   Period Weeks   Status Achieved   PT LONG TERM GOAL #5   Title Patient will decrease pain to 2/10 at worst to allow 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 She reports less back and LLE pain. Patient reports being able to manage pain with compliance with HEP in AM. PT advanced HEP to include standing exercise for progression of strengthening. She required min Vcs for correct exercise technique. Patient reports overall less pain and is independent in all ADLs. She reprots being able to shop for approximately 2-3 hours without pain. She has met most goals and therefore is appropriate for DC at this time.    Pt will benefit from skilled therapeutic intervention in order to improve on the following deficits Difficulty walking;Decreased range of motion;Decreased activity tolerance;Decreased strength;Decreased mobility;Decreased balance;Hypomobility;Increased fascial restricitons;Impaired flexibility;Postural dysfunction;Improper body mechanics;Pain;Abnormal gait   Rehab Potential Good   Clinical Impairments Affecting Rehab Potential Positive: recent onset of symptoms, minimal co-morbidities. Negative: severity of symptoms, progressive congition.    PT Frequency 2x / week   PT Duration 8 weeks   PT Treatment/Interventions ADLs/Self Care Home Management;Electrical Stimulation;Cryotherapy;Moist Heat;Traction;Ultrasound;Gait training;Stair  training;Functional mobility training;Therapeutic activities;Therapeutic exercise;Balance training;Neuromuscular re-education;Patient/family education;Manual techniques;Passive range of motion;Dry needling   PT Next Visit Plan discharged from PT   PT Home Exercise Plan advanced see patient instructions;   Consulted and Agree with Plan of Care Patient          G-Codes - 2015/08/11 1245    Functional Assessment Tool Used strength, ROM, clinical judgement   Functional Limitation Mobility: Walking and moving around   Mobility: Walking and Moving Around Goal Status [phone]) At least 1 percent but less than 20 percent impaired, limited or restricted   Mobility: Walking and Moving Around Discharge Status [phone]) At least 1 percent but less than 20 percent impaired, limited or restricted      Problem List There are no  active problems to display for this patient.   Trotter,Margaret PT, DPT 08/02/2015, 12:46 PM  [place] MAIN [facility] SERVICES [address] [place], [place], [address] Phone: [phone]   Fax:  [phone]  Name: AVALYNNE DIVER MRN: [id number] Date of Birth: 10/12/1945

## 2015-08-03 ENCOUNTER — Ambulatory Visit: Payer: PPO | Admitting: Physical Therapy

## 2015-11-30 DIAGNOSIS — M4806 Spinal stenosis, lumbar region: Secondary | ICD-10-CM | POA: Diagnosis not present

## 2015-11-30 DIAGNOSIS — M5136 Other intervertebral disc degeneration, lumbar region: Secondary | ICD-10-CM | POA: Diagnosis not present

## 2015-11-30 DIAGNOSIS — M4726 Other spondylosis with radiculopathy, lumbar region: Secondary | ICD-10-CM | POA: Diagnosis not present

## 2015-12-18 DIAGNOSIS — M722 Plantar fascial fibromatosis: Secondary | ICD-10-CM | POA: Diagnosis not present

## 2015-12-18 DIAGNOSIS — G43109 Migraine with aura, not intractable, without status migrainosus: Secondary | ICD-10-CM | POA: Diagnosis not present

## 2015-12-18 DIAGNOSIS — M5432 Sciatica, left side: Secondary | ICD-10-CM | POA: Diagnosis not present

## 2015-12-18 DIAGNOSIS — M5136 Other intervertebral disc degeneration, lumbar region: Secondary | ICD-10-CM | POA: Diagnosis not present

## 2015-12-18 DIAGNOSIS — L719 Rosacea, unspecified: Secondary | ICD-10-CM | POA: Diagnosis not present

## 2015-12-18 DIAGNOSIS — M519 Unspecified thoracic, thoracolumbar and lumbosacral intervertebral disc disorder: Secondary | ICD-10-CM | POA: Diagnosis not present

## 2015-12-18 DIAGNOSIS — I10 Essential (primary) hypertension: Secondary | ICD-10-CM | POA: Diagnosis not present

## 2015-12-18 DIAGNOSIS — I340 Nonrheumatic mitral (valve) insufficiency: Secondary | ICD-10-CM | POA: Diagnosis not present

## 2015-12-18 DIAGNOSIS — Z0001 Encounter for general adult medical examination with abnormal findings: Secondary | ICD-10-CM | POA: Diagnosis not present

## 2016-03-15 DIAGNOSIS — I10 Essential (primary) hypertension: Secondary | ICD-10-CM | POA: Diagnosis not present

## 2016-03-15 DIAGNOSIS — M519 Unspecified thoracic, thoracolumbar and lumbosacral intervertebral disc disorder: Secondary | ICD-10-CM | POA: Diagnosis not present

## 2016-03-15 DIAGNOSIS — L719 Rosacea, unspecified: Secondary | ICD-10-CM | POA: Diagnosis not present

## 2016-03-15 DIAGNOSIS — M722 Plantar fascial fibromatosis: Secondary | ICD-10-CM | POA: Diagnosis not present

## 2016-03-15 DIAGNOSIS — G43109 Migraine with aura, not intractable, without status migrainosus: Secondary | ICD-10-CM | POA: Diagnosis not present

## 2016-03-15 DIAGNOSIS — M5136 Other intervertebral disc degeneration, lumbar region: Secondary | ICD-10-CM | POA: Diagnosis not present

## 2016-03-15 DIAGNOSIS — I340 Nonrheumatic mitral (valve) insufficiency: Secondary | ICD-10-CM | POA: Diagnosis not present

## 2016-03-15 DIAGNOSIS — M5432 Sciatica, left side: Secondary | ICD-10-CM | POA: Diagnosis not present

## 2016-05-08 DIAGNOSIS — G43109 Migraine with aura, not intractable, without status migrainosus: Secondary | ICD-10-CM | POA: Diagnosis not present

## 2016-05-08 DIAGNOSIS — M519 Unspecified thoracic, thoracolumbar and lumbosacral intervertebral disc disorder: Secondary | ICD-10-CM | POA: Diagnosis not present

## 2016-05-08 DIAGNOSIS — E860 Dehydration: Secondary | ICD-10-CM | POA: Diagnosis not present

## 2016-05-08 DIAGNOSIS — G4709 Other insomnia: Secondary | ICD-10-CM | POA: Diagnosis not present

## 2016-05-08 DIAGNOSIS — M5432 Sciatica, left side: Secondary | ICD-10-CM | POA: Diagnosis not present

## 2016-05-08 DIAGNOSIS — K909 Intestinal malabsorption, unspecified: Secondary | ICD-10-CM | POA: Diagnosis not present

## 2016-05-08 DIAGNOSIS — I10 Essential (primary) hypertension: Secondary | ICD-10-CM | POA: Diagnosis not present

## 2016-05-08 DIAGNOSIS — M722 Plantar fascial fibromatosis: Secondary | ICD-10-CM | POA: Diagnosis not present

## 2016-05-08 DIAGNOSIS — I340 Nonrheumatic mitral (valve) insufficiency: Secondary | ICD-10-CM | POA: Diagnosis not present

## 2016-05-08 DIAGNOSIS — M5136 Other intervertebral disc degeneration, lumbar region: Secondary | ICD-10-CM | POA: Diagnosis not present

## 2016-05-08 DIAGNOSIS — Z23 Encounter for immunization: Secondary | ICD-10-CM | POA: Diagnosis not present

## 2016-05-08 DIAGNOSIS — L719 Rosacea, unspecified: Secondary | ICD-10-CM | POA: Diagnosis not present

## 2016-05-09 DIAGNOSIS — K909 Intestinal malabsorption, unspecified: Secondary | ICD-10-CM | POA: Diagnosis not present

## 2016-05-13 DIAGNOSIS — E860 Dehydration: Secondary | ICD-10-CM | POA: Diagnosis not present

## 2016-05-13 DIAGNOSIS — Z23 Encounter for immunization: Secondary | ICD-10-CM | POA: Diagnosis not present

## 2016-05-13 DIAGNOSIS — K909 Intestinal malabsorption, unspecified: Secondary | ICD-10-CM | POA: Diagnosis not present

## 2016-05-13 DIAGNOSIS — M5432 Sciatica, left side: Secondary | ICD-10-CM | POA: Diagnosis not present

## 2016-05-13 DIAGNOSIS — I10 Essential (primary) hypertension: Secondary | ICD-10-CM | POA: Diagnosis not present

## 2016-05-13 DIAGNOSIS — M5136 Other intervertebral disc degeneration, lumbar region: Secondary | ICD-10-CM | POA: Diagnosis not present

## 2016-05-13 DIAGNOSIS — G43109 Migraine with aura, not intractable, without status migrainosus: Secondary | ICD-10-CM | POA: Diagnosis not present

## 2016-05-13 DIAGNOSIS — M519 Unspecified thoracic, thoracolumbar and lumbosacral intervertebral disc disorder: Secondary | ICD-10-CM | POA: Diagnosis not present

## 2016-05-13 DIAGNOSIS — I340 Nonrheumatic mitral (valve) insufficiency: Secondary | ICD-10-CM | POA: Diagnosis not present

## 2016-05-13 DIAGNOSIS — M722 Plantar fascial fibromatosis: Secondary | ICD-10-CM | POA: Diagnosis not present

## 2016-05-13 DIAGNOSIS — L719 Rosacea, unspecified: Secondary | ICD-10-CM | POA: Diagnosis not present

## 2016-05-13 DIAGNOSIS — G4709 Other insomnia: Secondary | ICD-10-CM | POA: Diagnosis not present

## 2016-06-25 DIAGNOSIS — I340 Nonrheumatic mitral (valve) insufficiency: Secondary | ICD-10-CM | POA: Diagnosis not present

## 2016-06-25 DIAGNOSIS — M5136 Other intervertebral disc degeneration, lumbar region: Secondary | ICD-10-CM | POA: Diagnosis not present

## 2016-06-25 DIAGNOSIS — M5432 Sciatica, left side: Secondary | ICD-10-CM | POA: Diagnosis not present

## 2016-06-25 DIAGNOSIS — M722 Plantar fascial fibromatosis: Secondary | ICD-10-CM | POA: Diagnosis not present

## 2016-06-25 DIAGNOSIS — M519 Unspecified thoracic, thoracolumbar and lumbosacral intervertebral disc disorder: Secondary | ICD-10-CM | POA: Diagnosis not present

## 2016-06-25 DIAGNOSIS — Z0001 Encounter for general adult medical examination with abnormal findings: Secondary | ICD-10-CM | POA: Diagnosis not present

## 2016-06-25 DIAGNOSIS — I10 Essential (primary) hypertension: Secondary | ICD-10-CM | POA: Diagnosis not present

## 2016-06-25 DIAGNOSIS — L719 Rosacea, unspecified: Secondary | ICD-10-CM | POA: Diagnosis not present

## 2016-06-25 DIAGNOSIS — G43109 Migraine with aura, not intractable, without status migrainosus: Secondary | ICD-10-CM | POA: Diagnosis not present

## 2016-07-08 DIAGNOSIS — I10 Essential (primary) hypertension: Secondary | ICD-10-CM | POA: Diagnosis not present

## 2016-07-09 DIAGNOSIS — M5432 Sciatica, left side: Secondary | ICD-10-CM | POA: Diagnosis not present

## 2016-07-09 DIAGNOSIS — L719 Rosacea, unspecified: Secondary | ICD-10-CM | POA: Diagnosis not present

## 2016-07-09 DIAGNOSIS — G43109 Migraine with aura, not intractable, without status migrainosus: Secondary | ICD-10-CM | POA: Diagnosis not present

## 2016-07-09 DIAGNOSIS — M5136 Other intervertebral disc degeneration, lumbar region: Secondary | ICD-10-CM | POA: Diagnosis not present

## 2016-07-09 DIAGNOSIS — N181 Chronic kidney disease, stage 1: Secondary | ICD-10-CM | POA: Diagnosis not present

## 2016-07-09 DIAGNOSIS — I10 Essential (primary) hypertension: Secondary | ICD-10-CM | POA: Diagnosis not present

## 2016-07-09 DIAGNOSIS — I340 Nonrheumatic mitral (valve) insufficiency: Secondary | ICD-10-CM | POA: Diagnosis not present

## 2016-07-10 DIAGNOSIS — N181 Chronic kidney disease, stage 1: Secondary | ICD-10-CM | POA: Diagnosis not present

## 2016-07-10 DIAGNOSIS — H903 Sensorineural hearing loss, bilateral: Secondary | ICD-10-CM | POA: Diagnosis not present

## 2016-08-02 DIAGNOSIS — N181 Chronic kidney disease, stage 1: Secondary | ICD-10-CM | POA: Diagnosis not present

## 2016-08-08 DIAGNOSIS — N183 Chronic kidney disease, stage 3 (moderate): Secondary | ICD-10-CM | POA: Diagnosis not present

## 2016-08-08 DIAGNOSIS — N12 Tubulo-interstitial nephritis, not specified as acute or chronic: Secondary | ICD-10-CM | POA: Diagnosis not present

## 2016-08-08 DIAGNOSIS — I129 Hypertensive chronic kidney disease with stage 1 through stage 4 chronic kidney disease, or unspecified chronic kidney disease: Secondary | ICD-10-CM | POA: Diagnosis not present

## 2016-10-08 DIAGNOSIS — N179 Acute kidney failure, unspecified: Secondary | ICD-10-CM | POA: Diagnosis not present

## 2016-10-08 DIAGNOSIS — I10 Essential (primary) hypertension: Secondary | ICD-10-CM | POA: Diagnosis not present

## 2016-12-10 DIAGNOSIS — L719 Rosacea, unspecified: Secondary | ICD-10-CM | POA: Diagnosis not present

## 2016-12-10 DIAGNOSIS — I10 Essential (primary) hypertension: Secondary | ICD-10-CM | POA: Diagnosis not present

## 2016-12-10 DIAGNOSIS — G43109 Migraine with aura, not intractable, without status migrainosus: Secondary | ICD-10-CM | POA: Diagnosis not present

## 2016-12-10 DIAGNOSIS — M5136 Other intervertebral disc degeneration, lumbar region: Secondary | ICD-10-CM | POA: Diagnosis not present

## 2016-12-10 DIAGNOSIS — M5432 Sciatica, left side: Secondary | ICD-10-CM | POA: Diagnosis not present

## 2016-12-10 DIAGNOSIS — Z0001 Encounter for general adult medical examination with abnormal findings: Secondary | ICD-10-CM | POA: Diagnosis not present

## 2016-12-10 DIAGNOSIS — N181 Chronic kidney disease, stage 1: Secondary | ICD-10-CM | POA: Diagnosis not present

## 2016-12-10 DIAGNOSIS — I340 Nonrheumatic mitral (valve) insufficiency: Secondary | ICD-10-CM | POA: Diagnosis not present

## 2016-12-12 ENCOUNTER — Other Ambulatory Visit: Payer: Self-pay | Admitting: Internal Medicine

## 2016-12-12 DIAGNOSIS — Z1231 Encounter for screening mammogram for malignant neoplasm of breast: Secondary | ICD-10-CM

## 2016-12-31 DIAGNOSIS — Z1211 Encounter for screening for malignant neoplasm of colon: Secondary | ICD-10-CM | POA: Diagnosis not present

## 2016-12-31 DIAGNOSIS — I10 Essential (primary) hypertension: Secondary | ICD-10-CM | POA: Diagnosis not present

## 2017-01-10 ENCOUNTER — Ambulatory Visit: Payer: PPO

## 2017-01-21 ENCOUNTER — Encounter: Payer: Self-pay | Admitting: Radiology

## 2017-01-21 ENCOUNTER — Ambulatory Visit
Admission: RE | Admit: 2017-01-21 | Discharge: 2017-01-21 | Disposition: A | Discharge status: Home / Self Care | Payer: PPO | Source: Ambulatory Visit | Attending: Internal Medicine | Admitting: Internal Medicine

## 2017-01-21 DIAGNOSIS — Z1231 Encounter for screening mammogram for malignant neoplasm of breast: Secondary | ICD-10-CM | POA: Diagnosis not present

## 2017-02-27 DIAGNOSIS — I10 Essential (primary) hypertension: Secondary | ICD-10-CM | POA: Diagnosis not present

## 2017-02-27 DIAGNOSIS — R0602 Shortness of breath: Secondary | ICD-10-CM | POA: Diagnosis not present

## 2017-02-27 DIAGNOSIS — R079 Chest pain, unspecified: Secondary | ICD-10-CM | POA: Diagnosis not present

## 2017-02-27 DIAGNOSIS — I340 Nonrheumatic mitral (valve) insufficiency: Secondary | ICD-10-CM | POA: Diagnosis not present

## 2017-03-05 DIAGNOSIS — R0602 Shortness of breath: Secondary | ICD-10-CM | POA: Diagnosis not present

## 2017-03-05 DIAGNOSIS — I340 Nonrheumatic mitral (valve) insufficiency: Secondary | ICD-10-CM | POA: Diagnosis not present

## 2017-03-05 DIAGNOSIS — R079 Chest pain, unspecified: Secondary | ICD-10-CM | POA: Diagnosis not present

## 2017-03-05 DIAGNOSIS — I10 Essential (primary) hypertension: Secondary | ICD-10-CM | POA: Diagnosis not present

## 2017-03-10 DIAGNOSIS — R0602 Shortness of breath: Secondary | ICD-10-CM | POA: Diagnosis not present

## 2017-03-10 DIAGNOSIS — I340 Nonrheumatic mitral (valve) insufficiency: Secondary | ICD-10-CM | POA: Diagnosis not present

## 2017-06-10 DIAGNOSIS — Z23 Encounter for immunization: Secondary | ICD-10-CM | POA: Diagnosis not present

## 2017-06-16 DIAGNOSIS — H43811 Vitreous degeneration, right eye: Secondary | ICD-10-CM | POA: Diagnosis not present

## 2017-06-17 DIAGNOSIS — G43109 Migraine with aura, not intractable, without status migrainosus: Secondary | ICD-10-CM | POA: Diagnosis not present

## 2017-06-17 DIAGNOSIS — M5432 Sciatica, left side: Secondary | ICD-10-CM | POA: Diagnosis not present

## 2017-06-17 DIAGNOSIS — M5136 Other intervertebral disc degeneration, lumbar region: Secondary | ICD-10-CM | POA: Diagnosis not present

## 2017-06-17 DIAGNOSIS — L719 Rosacea, unspecified: Secondary | ICD-10-CM | POA: Diagnosis not present

## 2017-06-17 DIAGNOSIS — N181 Chronic kidney disease, stage 1: Secondary | ICD-10-CM | POA: Diagnosis not present

## 2017-06-17 DIAGNOSIS — I340 Nonrheumatic mitral (valve) insufficiency: Secondary | ICD-10-CM | POA: Diagnosis not present

## 2017-06-17 DIAGNOSIS — I10 Essential (primary) hypertension: Secondary | ICD-10-CM | POA: Diagnosis not present

## 2017-06-17 DIAGNOSIS — Z23 Encounter for immunization: Secondary | ICD-10-CM | POA: Diagnosis not present

## 2017-08-23 IMAGING — MG MM DIGITAL SCREENING BILAT W/ CAD
5 series · 5 of 5 positions shown · non-contrast
Comparison: Previous exam(s).

CLINICAL DATA: Screening.

EXAM:
DIGITAL SCREENING BILATERAL MAMMOGRAM WITH CAD

[R MLO (1 of 2)]
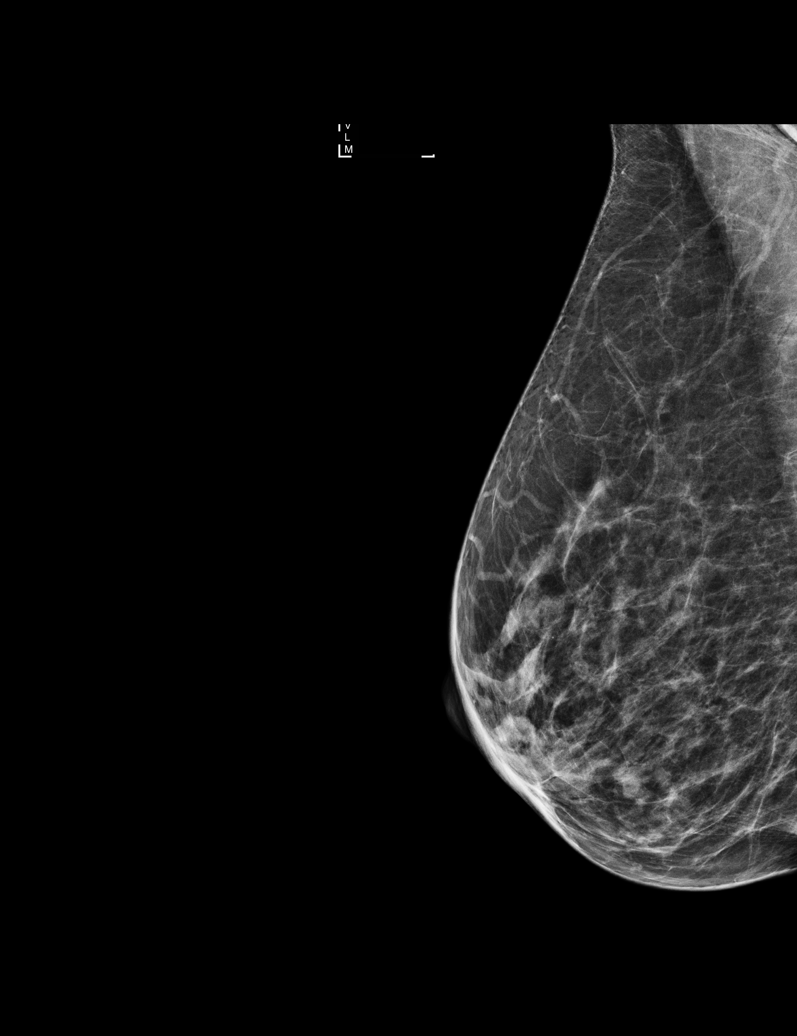

[R MLO (2 of 2)]
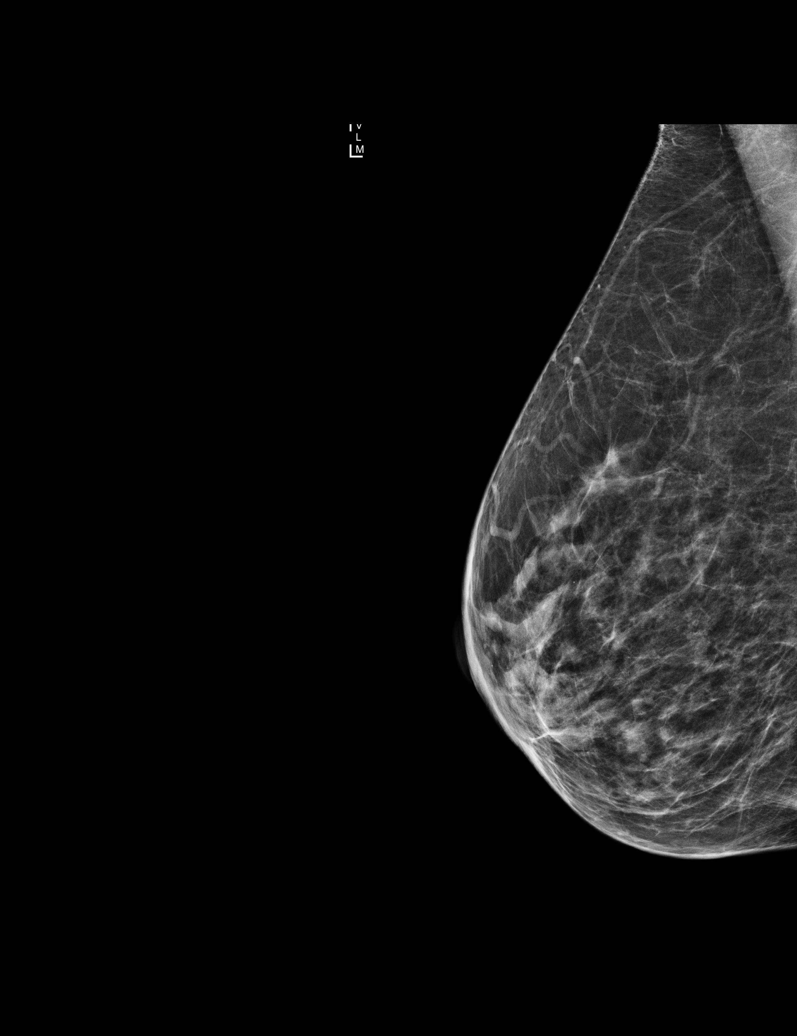

[L MLO]
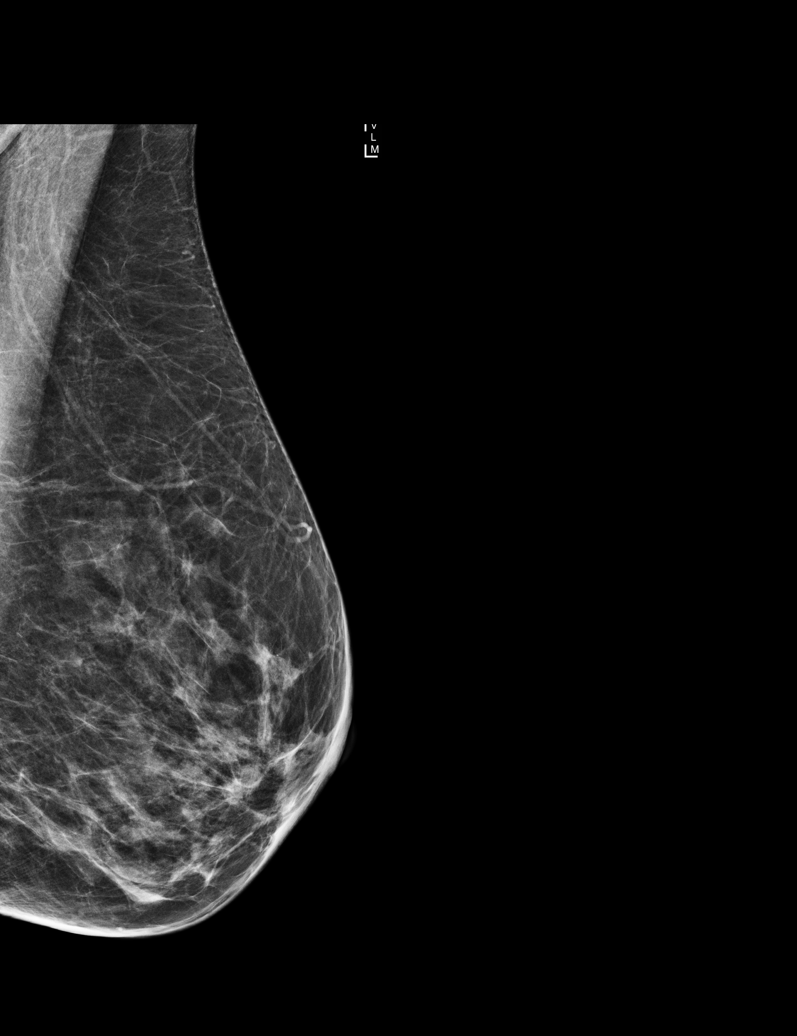

[L CC]
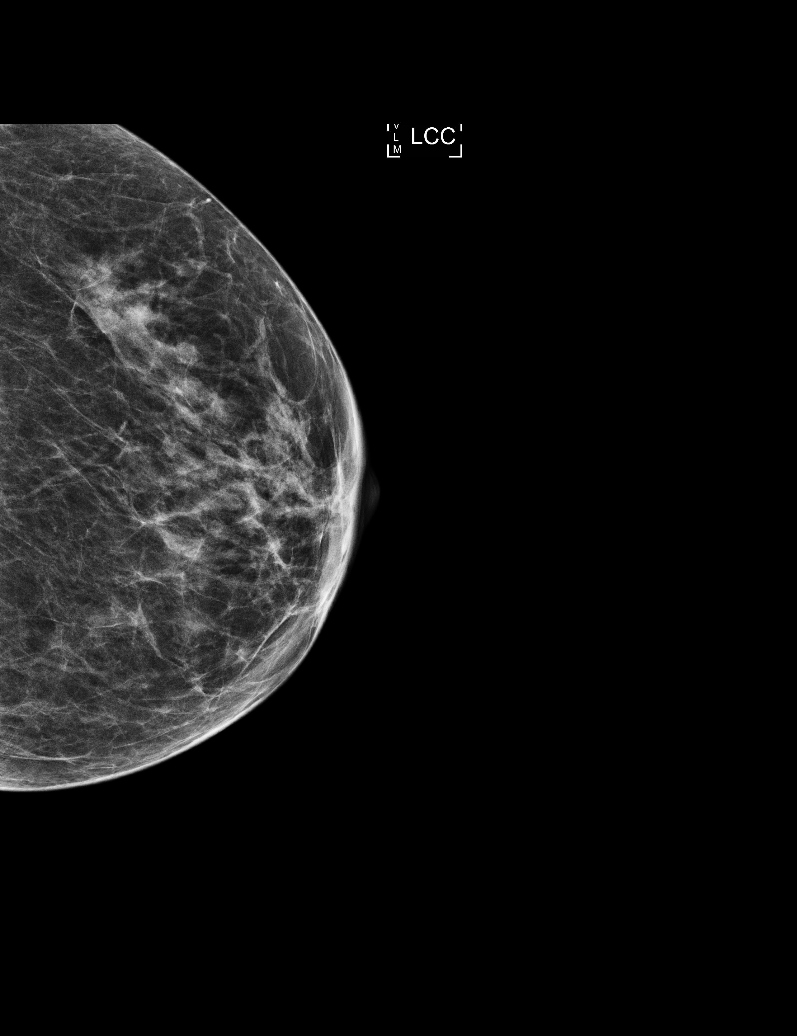

[R CC]
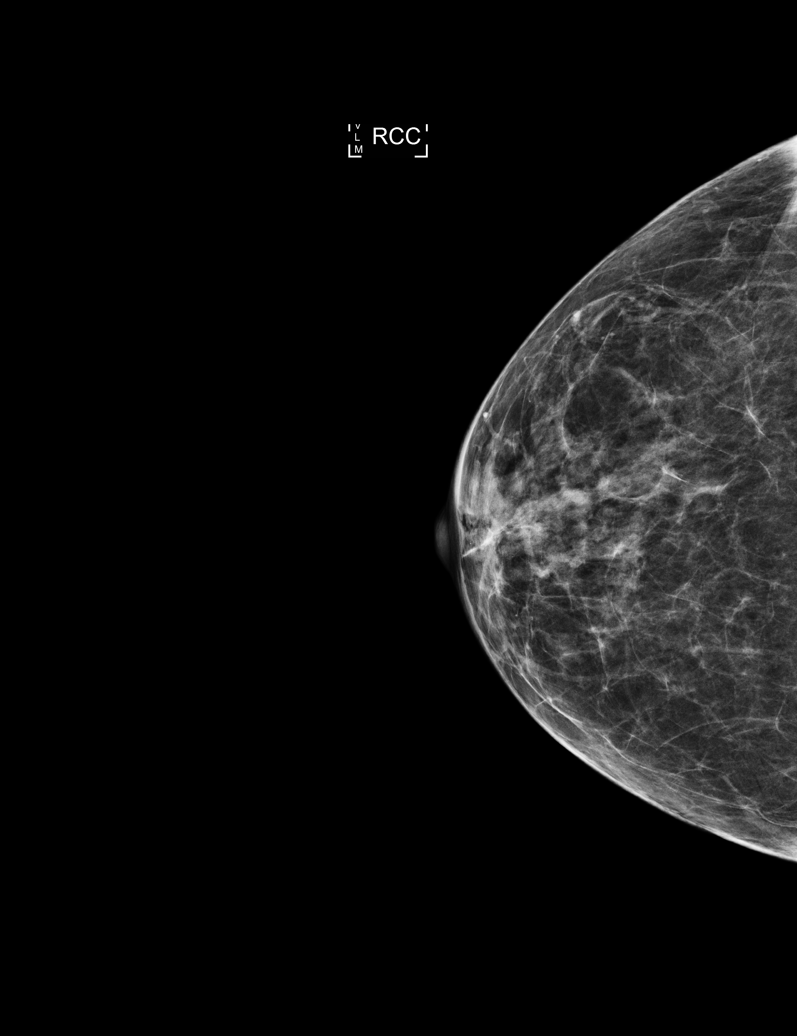

[5 of 5 positions shown; findings below may reference images not displayed]

ACR Breast Density Category c: The breast tissue is heterogeneously
dense, which may obscure small masses.
FINDINGS: There are no findings suspicious for malignancy. Images were
processed with CAD.
IMPRESSION: No mammographic evidence of malignancy. A result letter of this
screening mammogram will be mailed directly to the patient.

RECOMMENDATION:
Screening mammogram in one year. (Code:[id number])

BI-RADS CATEGORY  1: Negative.

## 2018-02-27 ENCOUNTER — Other Ambulatory Visit: Payer: Self-pay | Admitting: Internal Medicine

## 2018-02-27 DIAGNOSIS — Z1231 Encounter for screening mammogram for malignant neoplasm of breast: Secondary | ICD-10-CM

## 2018-03-17 ENCOUNTER — Ambulatory Visit
Admission: RE | Admit: 2018-03-17 | Discharge: 2018-03-17 | Disposition: A | Discharge status: Home / Self Care | Payer: Medicare HMO | Source: Ambulatory Visit | Attending: Internal Medicine | Admitting: Internal Medicine

## 2018-03-17 DIAGNOSIS — Z1231 Encounter for screening mammogram for malignant neoplasm of breast: Secondary | ICD-10-CM | POA: Insufficient documentation

## 2018-07-31 DIAGNOSIS — I10 Essential (primary) hypertension: Secondary | ICD-10-CM | POA: Diagnosis not present

## 2018-08-04 DIAGNOSIS — E785 Hyperlipidemia, unspecified: Secondary | ICD-10-CM | POA: Diagnosis not present

## 2018-08-04 DIAGNOSIS — G43109 Migraine with aura, not intractable, without status migrainosus: Secondary | ICD-10-CM | POA: Diagnosis not present

## 2018-08-04 DIAGNOSIS — F0390 Unspecified dementia without behavioral disturbance: Secondary | ICD-10-CM | POA: Diagnosis not present

## 2018-08-04 DIAGNOSIS — M5432 Sciatica, left side: Secondary | ICD-10-CM | POA: Diagnosis not present

## 2018-08-04 DIAGNOSIS — M5136 Other intervertebral disc degeneration, lumbar region: Secondary | ICD-10-CM | POA: Diagnosis not present

## 2018-08-04 DIAGNOSIS — I10 Essential (primary) hypertension: Secondary | ICD-10-CM | POA: Diagnosis not present

## 2018-08-04 DIAGNOSIS — I340 Nonrheumatic mitral (valve) insufficiency: Secondary | ICD-10-CM | POA: Diagnosis not present

## 2018-08-04 DIAGNOSIS — S92415A Nondisplaced fracture of proximal phalanx of left great toe, initial encounter for closed fracture: Secondary | ICD-10-CM | POA: Diagnosis not present

## 2018-10-14 DIAGNOSIS — M9905 Segmental and somatic dysfunction of pelvic region: Secondary | ICD-10-CM | POA: Diagnosis not present

## 2018-10-14 DIAGNOSIS — M955 Acquired deformity of pelvis: Secondary | ICD-10-CM | POA: Diagnosis not present

## 2018-10-14 DIAGNOSIS — M41127 Adolescent idiopathic scoliosis, lumbosacral region: Secondary | ICD-10-CM | POA: Diagnosis not present

## 2018-10-14 DIAGNOSIS — M9903 Segmental and somatic dysfunction of lumbar region: Secondary | ICD-10-CM | POA: Diagnosis not present

## 2018-11-04 ENCOUNTER — Other Ambulatory Visit: Payer: Self-pay | Admitting: *Deleted

## 2018-11-04 NOTE — Patient Outreach (Signed)
.  [facility]RA Follow up outreach. No answer at listed phone number and unable to leave a message. I will call again within 10 days.  Cindy Patel. Myrtie Neither, MSN, [facility] Gerontological Nurse Practitioner [facility] Care Management [phone]

## 2018-11-11 DIAGNOSIS — M955 Acquired deformity of pelvis: Secondary | ICD-10-CM | POA: Diagnosis not present

## 2018-11-11 DIAGNOSIS — M9905 Segmental and somatic dysfunction of pelvic region: Secondary | ICD-10-CM | POA: Diagnosis not present

## 2018-11-11 DIAGNOSIS — M41127 Adolescent idiopathic scoliosis, lumbosacral region: Secondary | ICD-10-CM | POA: Diagnosis not present

## 2018-11-11 DIAGNOSIS — M9903 Segmental and somatic dysfunction of lumbar region: Secondary | ICD-10-CM | POA: Diagnosis not present

## 2018-12-25 ENCOUNTER — Other Ambulatory Visit: Payer: Self-pay | Admitting: *Deleted

## 2018-12-25 ENCOUNTER — Encounter: Payer: Self-pay | Admitting: *Deleted

## 2018-12-25 NOTE — Patient Outreach (Signed)
[facility]RA Screening outreach attempt #2 without success. Left a message and advised will send a letter regarding services.  [name] [name], MSN, GNP-BC Gerontological Nurse Practitioner [facility] Care Management [phone]   

## 2019-02-09 DIAGNOSIS — E785 Hyperlipidemia, unspecified: Secondary | ICD-10-CM | POA: Diagnosis not present

## 2019-02-09 DIAGNOSIS — I10 Essential (primary) hypertension: Secondary | ICD-10-CM | POA: Diagnosis not present

## 2019-02-11 ENCOUNTER — Other Ambulatory Visit: Payer: Self-pay

## 2019-02-11 DIAGNOSIS — Z20822 Contact with and (suspected) exposure to covid-19: Secondary | ICD-10-CM

## 2019-02-16 LAB — NOVEL CORONAVIRUS, NAA: SARS-CoV-2, NAA: NOT DETECTED

## 2019-02-18 ENCOUNTER — Telehealth: Payer: Self-pay | Admitting: General Practice

## 2019-02-18 NOTE — Telephone Encounter (Signed)
Patient given negative covid-19 result. Patient verbalized understanding.

## 2019-02-23 DIAGNOSIS — M5136 Other intervertebral disc degeneration, lumbar region: Secondary | ICD-10-CM | POA: Diagnosis not present

## 2019-02-23 DIAGNOSIS — G43109 Migraine with aura, not intractable, without status migrainosus: Secondary | ICD-10-CM | POA: Diagnosis not present

## 2019-02-23 DIAGNOSIS — S92415A Nondisplaced fracture of proximal phalanx of left great toe, initial encounter for closed fracture: Secondary | ICD-10-CM | POA: Diagnosis not present

## 2019-02-23 DIAGNOSIS — I340 Nonrheumatic mitral (valve) insufficiency: Secondary | ICD-10-CM | POA: Diagnosis not present

## 2019-02-23 DIAGNOSIS — M5432 Sciatica, left side: Secondary | ICD-10-CM | POA: Diagnosis not present

## 2019-02-23 DIAGNOSIS — F0390 Unspecified dementia without behavioral disturbance: Secondary | ICD-10-CM | POA: Diagnosis not present

## 2019-02-23 DIAGNOSIS — E785 Hyperlipidemia, unspecified: Secondary | ICD-10-CM | POA: Diagnosis not present

## 2019-02-23 DIAGNOSIS — I10 Essential (primary) hypertension: Secondary | ICD-10-CM | POA: Diagnosis not present

## 2019-03-15 ENCOUNTER — Other Ambulatory Visit: Payer: Self-pay | Admitting: Internal Medicine

## 2019-03-15 DIAGNOSIS — Z1231 Encounter for screening mammogram for malignant neoplasm of breast: Secondary | ICD-10-CM

## 2019-04-12 DIAGNOSIS — N183 Chronic kidney disease, stage 3 (moderate): Secondary | ICD-10-CM | POA: Diagnosis not present

## 2019-04-12 DIAGNOSIS — I129 Hypertensive chronic kidney disease with stage 1 through stage 4 chronic kidney disease, or unspecified chronic kidney disease: Secondary | ICD-10-CM | POA: Insufficient documentation

## 2019-04-14 ENCOUNTER — Ambulatory Visit
Admission: RE | Admit: 2019-04-14 | Discharge: 2019-04-14 | Disposition: A | Discharge status: Home / Self Care | Payer: PPO | Source: Ambulatory Visit | Attending: Internal Medicine | Admitting: Internal Medicine

## 2019-04-14 DIAGNOSIS — Z1231 Encounter for screening mammogram for malignant neoplasm of breast: Secondary | ICD-10-CM

## 2019-06-08 ENCOUNTER — Other Ambulatory Visit: Payer: Self-pay

## 2019-06-08 DIAGNOSIS — Z20822 Contact with and (suspected) exposure to covid-19: Secondary | ICD-10-CM

## 2019-06-10 LAB — NOVEL CORONAVIRUS, NAA: SARS-CoV-2, NAA: NOT DETECTED

## 2019-06-17 DIAGNOSIS — M41127 Adolescent idiopathic scoliosis, lumbosacral region: Secondary | ICD-10-CM | POA: Diagnosis not present

## 2019-06-17 DIAGNOSIS — M955 Acquired deformity of pelvis: Secondary | ICD-10-CM | POA: Diagnosis not present

## 2019-06-17 DIAGNOSIS — M9903 Segmental and somatic dysfunction of lumbar region: Secondary | ICD-10-CM | POA: Diagnosis not present

## 2019-06-17 DIAGNOSIS — M9905 Segmental and somatic dysfunction of pelvic region: Secondary | ICD-10-CM | POA: Diagnosis not present

## 2019-08-17 DIAGNOSIS — I10 Essential (primary) hypertension: Secondary | ICD-10-CM | POA: Diagnosis not present

## 2019-08-17 DIAGNOSIS — E785 Hyperlipidemia, unspecified: Secondary | ICD-10-CM | POA: Diagnosis not present

## 2019-08-20 DIAGNOSIS — S92415A Nondisplaced fracture of proximal phalanx of left great toe, initial encounter for closed fracture: Secondary | ICD-10-CM | POA: Diagnosis not present

## 2019-08-20 DIAGNOSIS — I340 Nonrheumatic mitral (valve) insufficiency: Secondary | ICD-10-CM | POA: Diagnosis not present

## 2019-08-20 DIAGNOSIS — E785 Hyperlipidemia, unspecified: Secondary | ICD-10-CM | POA: Diagnosis not present

## 2019-08-20 DIAGNOSIS — F0390 Unspecified dementia without behavioral disturbance: Secondary | ICD-10-CM | POA: Diagnosis not present

## 2019-08-20 DIAGNOSIS — M5432 Sciatica, left side: Secondary | ICD-10-CM | POA: Diagnosis not present

## 2019-08-20 DIAGNOSIS — I10 Essential (primary) hypertension: Secondary | ICD-10-CM | POA: Diagnosis not present

## 2019-08-20 DIAGNOSIS — M5136 Other intervertebral disc degeneration, lumbar region: Secondary | ICD-10-CM | POA: Diagnosis not present

## 2019-08-20 DIAGNOSIS — G43109 Migraine with aura, not intractable, without status migrainosus: Secondary | ICD-10-CM | POA: Diagnosis not present

## 2019-09-20 ENCOUNTER — Ambulatory Visit: Payer: 59 | Attending: Internal Medicine

## 2019-09-20 DIAGNOSIS — Z23 Encounter for immunization: Secondary | ICD-10-CM | POA: Insufficient documentation

## 2019-09-20 NOTE — Progress Notes (Signed)
   Covid-19 Vaccination Clinic  Name:  Cindy Patel    MRN: [id number] DOB: Nov 29, 1945  09/20/2019  Cindy Patel was observed post Covid-19 immunization for 15 minutes without incidence. She was provided with Vaccine Information Sheet and instruction to access the V-Safe system.   Cindy Patel was instructed to call 911 with any severe reactions post vaccine: Marland Kitchen Difficulty breathing  . Swelling of your face and throat  . A fast heartbeat  . A bad rash all over your body  . Dizziness and weakness    Immunizations Administered    Name Date Dose VIS Date Route   Moderna COVID-19 Vaccine 09/20/2019  1:45 PM 0.5 mL 06/29/2019 Intramuscular   Manufacturer: Moderna   Lot: [id number]   [id number]

## 2019-10-04 DIAGNOSIS — I10 Essential (primary) hypertension: Secondary | ICD-10-CM | POA: Diagnosis not present

## 2019-10-04 DIAGNOSIS — I129 Hypertensive chronic kidney disease with stage 1 through stage 4 chronic kidney disease, or unspecified chronic kidney disease: Secondary | ICD-10-CM | POA: Diagnosis not present

## 2019-10-04 DIAGNOSIS — N179 Acute kidney failure, unspecified: Secondary | ICD-10-CM | POA: Diagnosis not present

## 2019-10-04 DIAGNOSIS — N1831 Chronic kidney disease, stage 3a: Secondary | ICD-10-CM | POA: Diagnosis not present

## 2019-10-19 ENCOUNTER — Ambulatory Visit: Payer: 59 | Attending: Internal Medicine

## 2019-10-19 DIAGNOSIS — Z23 Encounter for immunization: Secondary | ICD-10-CM

## 2019-10-19 NOTE — Progress Notes (Signed)
   Covid-19 Vaccination Clinic  Name:  Cindy Patel    MRN: [id number] DOB: [birth year]  10/19/2019  Cindy Patel was observed post Covid-19 immunization for 15 minutes without incident. She was provided with Vaccine Information Sheet and instruction to access the V-Safe system.   Cindy Patel was instructed to call 911 with any severe reactions post vaccine: Marland Kitchen Difficulty breathing  . Swelling of face and throat  . A fast heartbeat  . A bad rash all over body  . Dizziness and weakness   Immunizations Administered    Name Date Dose VIS Date Route   Moderna COVID-19 Vaccine 10/19/2019 11:15 AM 0.5 mL 06/29/2019 Intramuscular   Manufacturer: Levan Hurst   LotUT:[id number]   [id number]

## 2019-11-02 DIAGNOSIS — M9903 Segmental and somatic dysfunction of lumbar region: Secondary | ICD-10-CM | POA: Diagnosis not present

## 2019-11-02 DIAGNOSIS — M955 Acquired deformity of pelvis: Secondary | ICD-10-CM | POA: Diagnosis not present

## 2019-11-02 DIAGNOSIS — M41127 Adolescent idiopathic scoliosis, lumbosacral region: Secondary | ICD-10-CM | POA: Diagnosis not present

## 2019-11-02 DIAGNOSIS — M9905 Segmental and somatic dysfunction of pelvic region: Secondary | ICD-10-CM | POA: Diagnosis not present

## 2019-11-26 ENCOUNTER — Other Ambulatory Visit: Payer: Self-pay

## 2019-11-26 ENCOUNTER — Ambulatory Visit: Payer: 59 | Admitting: Dermatology

## 2019-11-26 DIAGNOSIS — D229 Melanocytic nevi, unspecified: Secondary | ICD-10-CM

## 2019-11-26 DIAGNOSIS — D489 Neoplasm of uncertain behavior, unspecified: Secondary | ICD-10-CM

## 2019-11-26 DIAGNOSIS — D485 Neoplasm of uncertain behavior of skin: Secondary | ICD-10-CM

## 2019-11-26 DIAGNOSIS — D225 Melanocytic nevi of trunk: Secondary | ICD-10-CM | POA: Diagnosis not present

## 2019-11-26 NOTE — Progress Notes (Signed)
   New Patient Visit  Subjective  Cindy Patel is a 74 y.o. female who presents for the following: New Patient (Initial Visit) (has a mole on her back that bleeds on occassion, is not bleeding at this time, noticed in past few months).   The following portions of the chart were reviewed this encounter and updated as appropriate:      Review of Systems:  No other skin or systemic complaints except as noted in HPI or Assessment and Plan.  Objective  Well appearing patient in no apparent distress; mood and affect are within normal limits.  A focused examination was performed including Back. Relevant physical exam findings are noted in the Assessment and Plan.  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  Epidermal / dermal shaving  Lesion length (cm):  0.5 Lesion width (cm):  0.5 Total excision diameter (cm):  0.5 Informed consent: discussed and consent obtained   Instrument used: flexible razor blade   Hemostasis achieved with: pressure and aluminum chloride   Outcome: patient tolerated procedure well   Post-procedure details: wound care instructions given   Additional details:  Mupirocin and Bandaid applied  Specimen 1 - Surgical pathology Differential Diagnosis: irritated nevus vs other Check Margins: No 81mm fleshy papule, spinal lower back  Shave removal today  Melanocytic Nevi - Tan-brown and/or pink-flesh-colored symmetric macules and papules - Benign appearing on exam today - Observation - Call clinic for new or changing moles - Recommend daily use of broad spectrum spf 30+ sunscreen to sun-exposed areas.    Return if symptoms worsen or fail to improve.   Marene Lenz, CMA, am acting as scribe for Brendolyn Patty, MD .  Documentation: I have reviewed the above documentation for accuracy and completeness, and I agree with the above.  Brendolyn Patty, MD

## 2019-11-26 NOTE — Patient Instructions (Addendum)
Recommend daily broad spectrum sunscreen SPF 30+ to sun-exposed areas, reapply every 2 hours as needed. Call for new or changing lesions.  Wound Care Instructions  1. Cleanse wound gently with soap and water once a day then pat dry with clean gauze. Apply a thing coat of Petrolatum (petroleum jelly, "Vaseline") over the wound (unless you have an allergy to this). We recommend that you use a new, sterile tube of Vaseline. Do not pick or remove scabs. Do not remove the yellow or white "healing tissue" from the base of the wound.  2. Cover the wound with fresh, clean, nonstick gauze and secure with paper tape. You may use Band-Aids in place of gauze and tape if the would is small enough, but would recommend trimming much of the tape off as there is often too much. Sometimes Band-Aids can irritate the skin.  3. You should call the office for your biopsy report after 1 week if you have not already been contacted.  4. If you experience any problems, such as abnormal amounts of bleeding, swelling, significant bruising, significant pain, or evidence of infection, please call the office immediately.

## 2019-12-08 ENCOUNTER — Telehealth: Payer: Self-pay

## 2019-12-08 NOTE — Telephone Encounter (Signed)
Lft pt msg advising bx results./sh 

## 2019-12-08 NOTE — Telephone Encounter (Signed)
-----   Message from Brendolyn Patty, MD sent at 12/07/2019  4:46 PM EDT ----- Skin , spinal lower back MELANOCYTIC NEVUS, INTRADERMAL TYPE  Benign mole

## 2020-01-19 DIAGNOSIS — F0390 Unspecified dementia without behavioral disturbance: Secondary | ICD-10-CM | POA: Diagnosis not present

## 2020-01-19 DIAGNOSIS — L255 Unspecified contact dermatitis due to plants, except food: Secondary | ICD-10-CM | POA: Diagnosis not present

## 2020-01-19 DIAGNOSIS — G43109 Migraine with aura, not intractable, without status migrainosus: Secondary | ICD-10-CM | POA: Diagnosis not present

## 2020-01-19 DIAGNOSIS — M5432 Sciatica, left side: Secondary | ICD-10-CM | POA: Diagnosis not present

## 2020-01-19 DIAGNOSIS — E785 Hyperlipidemia, unspecified: Secondary | ICD-10-CM | POA: Diagnosis not present

## 2020-01-19 DIAGNOSIS — I10 Essential (primary) hypertension: Secondary | ICD-10-CM | POA: Diagnosis not present

## 2020-01-19 DIAGNOSIS — S92415A Nondisplaced fracture of proximal phalanx of left great toe, initial encounter for closed fracture: Secondary | ICD-10-CM | POA: Diagnosis not present

## 2020-01-19 DIAGNOSIS — M5136 Other intervertebral disc degeneration, lumbar region: Secondary | ICD-10-CM | POA: Diagnosis not present

## 2020-01-19 DIAGNOSIS — I340 Nonrheumatic mitral (valve) insufficiency: Secondary | ICD-10-CM | POA: Diagnosis not present

## 2020-03-10 DIAGNOSIS — I10 Essential (primary) hypertension: Secondary | ICD-10-CM | POA: Diagnosis not present

## 2020-03-10 DIAGNOSIS — R7301 Impaired fasting glucose: Secondary | ICD-10-CM | POA: Diagnosis not present

## 2020-03-14 DIAGNOSIS — M5136 Other intervertebral disc degeneration, lumbar region: Secondary | ICD-10-CM | POA: Diagnosis not present

## 2020-03-14 DIAGNOSIS — I340 Nonrheumatic mitral (valve) insufficiency: Secondary | ICD-10-CM | POA: Diagnosis not present

## 2020-03-14 DIAGNOSIS — I10 Essential (primary) hypertension: Secondary | ICD-10-CM | POA: Diagnosis not present

## 2020-03-14 DIAGNOSIS — F0390 Unspecified dementia without behavioral disturbance: Secondary | ICD-10-CM | POA: Diagnosis not present

## 2020-03-14 DIAGNOSIS — G43109 Migraine with aura, not intractable, without status migrainosus: Secondary | ICD-10-CM | POA: Diagnosis not present

## 2020-03-14 DIAGNOSIS — M5432 Sciatica, left side: Secondary | ICD-10-CM | POA: Diagnosis not present

## 2020-03-14 DIAGNOSIS — E785 Hyperlipidemia, unspecified: Secondary | ICD-10-CM | POA: Diagnosis not present

## 2020-04-05 DIAGNOSIS — I129 Hypertensive chronic kidney disease with stage 1 through stage 4 chronic kidney disease, or unspecified chronic kidney disease: Secondary | ICD-10-CM | POA: Diagnosis not present

## 2020-04-05 DIAGNOSIS — N1831 Chronic kidney disease, stage 3a: Secondary | ICD-10-CM | POA: Diagnosis not present

## 2020-06-12 DIAGNOSIS — J301 Allergic rhinitis due to pollen: Secondary | ICD-10-CM | POA: Diagnosis not present

## 2020-06-12 DIAGNOSIS — H6121 Impacted cerumen, right ear: Secondary | ICD-10-CM | POA: Diagnosis not present

## 2020-06-12 DIAGNOSIS — H903 Sensorineural hearing loss, bilateral: Secondary | ICD-10-CM | POA: Diagnosis not present

## 2020-06-20 DIAGNOSIS — I340 Nonrheumatic mitral (valve) insufficiency: Secondary | ICD-10-CM | POA: Diagnosis not present

## 2020-06-20 DIAGNOSIS — M5432 Sciatica, left side: Secondary | ICD-10-CM | POA: Diagnosis not present

## 2020-06-20 DIAGNOSIS — Z739 Problem related to life management difficulty, unspecified: Secondary | ICD-10-CM | POA: Diagnosis not present

## 2020-06-20 DIAGNOSIS — M5136 Other intervertebral disc degeneration, lumbar region: Secondary | ICD-10-CM | POA: Diagnosis not present

## 2020-06-20 DIAGNOSIS — E785 Hyperlipidemia, unspecified: Secondary | ICD-10-CM | POA: Diagnosis not present

## 2020-06-20 DIAGNOSIS — Z0000 Encounter for general adult medical examination without abnormal findings: Secondary | ICD-10-CM | POA: Diagnosis not present

## 2020-06-20 DIAGNOSIS — J301 Allergic rhinitis due to pollen: Secondary | ICD-10-CM | POA: Diagnosis not present

## 2020-06-20 DIAGNOSIS — G43109 Migraine with aura, not intractable, without status migrainosus: Secondary | ICD-10-CM | POA: Diagnosis not present

## 2020-06-20 DIAGNOSIS — Z1389 Encounter for screening for other disorder: Secondary | ICD-10-CM | POA: Diagnosis not present

## 2020-06-20 DIAGNOSIS — I10 Essential (primary) hypertension: Secondary | ICD-10-CM | POA: Diagnosis not present

## 2020-06-20 DIAGNOSIS — F0390 Unspecified dementia without behavioral disturbance: Secondary | ICD-10-CM | POA: Diagnosis not present

## 2020-10-14 DIAGNOSIS — Z7722 Contact with and (suspected) exposure to environmental tobacco smoke (acute) (chronic): Secondary | ICD-10-CM | POA: Diagnosis not present

## 2020-10-14 DIAGNOSIS — Z8249 Family history of ischemic heart disease and other diseases of the circulatory system: Secondary | ICD-10-CM | POA: Diagnosis not present

## 2020-10-14 DIAGNOSIS — G309 Alzheimer's disease, unspecified: Secondary | ICD-10-CM | POA: Diagnosis not present

## 2020-10-14 DIAGNOSIS — M810 Age-related osteoporosis without current pathological fracture: Secondary | ICD-10-CM | POA: Diagnosis not present

## 2020-10-14 DIAGNOSIS — Z008 Encounter for other general examination: Secondary | ICD-10-CM | POA: Diagnosis not present

## 2020-10-14 DIAGNOSIS — R69 Illness, unspecified: Secondary | ICD-10-CM | POA: Diagnosis not present

## 2020-10-14 DIAGNOSIS — Z7983 Long term (current) use of bisphosphonates: Secondary | ICD-10-CM | POA: Diagnosis not present

## 2020-10-14 DIAGNOSIS — J309 Allergic rhinitis, unspecified: Secondary | ICD-10-CM | POA: Diagnosis not present

## 2020-10-14 DIAGNOSIS — I951 Orthostatic hypotension: Secondary | ICD-10-CM | POA: Diagnosis not present

## 2020-10-14 DIAGNOSIS — R32 Unspecified urinary incontinence: Secondary | ICD-10-CM | POA: Diagnosis not present

## 2020-10-14 DIAGNOSIS — I10 Essential (primary) hypertension: Secondary | ICD-10-CM | POA: Diagnosis not present

## 2020-12-15 DIAGNOSIS — E785 Hyperlipidemia, unspecified: Secondary | ICD-10-CM | POA: Diagnosis not present

## 2020-12-15 DIAGNOSIS — I10 Essential (primary) hypertension: Secondary | ICD-10-CM | POA: Diagnosis not present

## 2020-12-19 DIAGNOSIS — R69 Illness, unspecified: Secondary | ICD-10-CM | POA: Diagnosis not present

## 2020-12-19 DIAGNOSIS — M5432 Sciatica, left side: Secondary | ICD-10-CM | POA: Diagnosis not present

## 2020-12-19 DIAGNOSIS — G43109 Migraine with aura, not intractable, without status migrainosus: Secondary | ICD-10-CM | POA: Diagnosis not present

## 2020-12-19 DIAGNOSIS — N189 Chronic kidney disease, unspecified: Secondary | ICD-10-CM | POA: Diagnosis not present

## 2020-12-19 DIAGNOSIS — J301 Allergic rhinitis due to pollen: Secondary | ICD-10-CM | POA: Diagnosis not present

## 2020-12-19 DIAGNOSIS — I10 Essential (primary) hypertension: Secondary | ICD-10-CM | POA: Diagnosis not present

## 2020-12-19 DIAGNOSIS — E785 Hyperlipidemia, unspecified: Secondary | ICD-10-CM | POA: Diagnosis not present

## 2020-12-19 DIAGNOSIS — M5136 Other intervertebral disc degeneration, lumbar region: Secondary | ICD-10-CM | POA: Diagnosis not present

## 2021-02-06 DIAGNOSIS — N189 Chronic kidney disease, unspecified: Secondary | ICD-10-CM | POA: Diagnosis not present

## 2021-02-07 DIAGNOSIS — M5432 Sciatica, left side: Secondary | ICD-10-CM | POA: Diagnosis not present

## 2021-02-07 DIAGNOSIS — J301 Allergic rhinitis due to pollen: Secondary | ICD-10-CM | POA: Diagnosis not present

## 2021-02-07 DIAGNOSIS — I10 Essential (primary) hypertension: Secondary | ICD-10-CM | POA: Diagnosis not present

## 2021-02-07 DIAGNOSIS — E785 Hyperlipidemia, unspecified: Secondary | ICD-10-CM | POA: Diagnosis not present

## 2021-02-07 DIAGNOSIS — N189 Chronic kidney disease, unspecified: Secondary | ICD-10-CM | POA: Diagnosis not present

## 2021-02-07 DIAGNOSIS — R69 Illness, unspecified: Secondary | ICD-10-CM | POA: Diagnosis not present

## 2021-02-07 DIAGNOSIS — G43109 Migraine with aura, not intractable, without status migrainosus: Secondary | ICD-10-CM | POA: Diagnosis not present

## 2021-02-07 DIAGNOSIS — M5136 Other intervertebral disc degeneration, lumbar region: Secondary | ICD-10-CM | POA: Diagnosis not present

## 2021-03-08 DIAGNOSIS — I10 Essential (primary) hypertension: Secondary | ICD-10-CM | POA: Diagnosis not present

## 2021-03-08 DIAGNOSIS — H52229 Regular astigmatism, unspecified eye: Secondary | ICD-10-CM | POA: Diagnosis not present

## 2021-04-09 DIAGNOSIS — N1831 Chronic kidney disease, stage 3a: Secondary | ICD-10-CM | POA: Diagnosis not present

## 2021-04-09 DIAGNOSIS — I129 Hypertensive chronic kidney disease with stage 1 through stage 4 chronic kidney disease, or unspecified chronic kidney disease: Secondary | ICD-10-CM | POA: Diagnosis not present

## 2021-04-16 DIAGNOSIS — I129 Hypertensive chronic kidney disease with stage 1 through stage 4 chronic kidney disease, or unspecified chronic kidney disease: Secondary | ICD-10-CM | POA: Diagnosis not present

## 2021-04-16 DIAGNOSIS — N1831 Chronic kidney disease, stage 3a: Secondary | ICD-10-CM | POA: Diagnosis not present

## 2021-05-09 DIAGNOSIS — M9903 Segmental and somatic dysfunction of lumbar region: Secondary | ICD-10-CM | POA: Diagnosis not present

## 2021-05-09 DIAGNOSIS — M41127 Adolescent idiopathic scoliosis, lumbosacral region: Secondary | ICD-10-CM | POA: Diagnosis not present

## 2021-05-09 DIAGNOSIS — M955 Acquired deformity of pelvis: Secondary | ICD-10-CM | POA: Diagnosis not present

## 2021-05-09 DIAGNOSIS — M9905 Segmental and somatic dysfunction of pelvic region: Secondary | ICD-10-CM | POA: Diagnosis not present

## 2021-08-03 DIAGNOSIS — N189 Chronic kidney disease, unspecified: Secondary | ICD-10-CM | POA: Diagnosis not present

## 2021-08-03 DIAGNOSIS — E785 Hyperlipidemia, unspecified: Secondary | ICD-10-CM | POA: Diagnosis not present

## 2021-08-14 DIAGNOSIS — G43109 Migraine with aura, not intractable, without status migrainosus: Secondary | ICD-10-CM | POA: Diagnosis not present

## 2021-08-14 DIAGNOSIS — M5432 Sciatica, left side: Secondary | ICD-10-CM | POA: Diagnosis not present

## 2021-08-14 DIAGNOSIS — J301 Allergic rhinitis due to pollen: Secondary | ICD-10-CM | POA: Diagnosis not present

## 2021-08-14 DIAGNOSIS — M5136 Other intervertebral disc degeneration, lumbar region: Secondary | ICD-10-CM | POA: Diagnosis not present

## 2021-08-14 DIAGNOSIS — E785 Hyperlipidemia, unspecified: Secondary | ICD-10-CM | POA: Diagnosis not present

## 2021-08-14 DIAGNOSIS — I10 Essential (primary) hypertension: Secondary | ICD-10-CM | POA: Diagnosis not present

## 2021-08-14 DIAGNOSIS — N189 Chronic kidney disease, unspecified: Secondary | ICD-10-CM | POA: Diagnosis not present

## 2021-08-14 DIAGNOSIS — R69 Illness, unspecified: Secondary | ICD-10-CM | POA: Diagnosis not present

## 2021-08-14 DIAGNOSIS — I340 Nonrheumatic mitral (valve) insufficiency: Secondary | ICD-10-CM | POA: Diagnosis not present

## 2021-08-14 DIAGNOSIS — M810 Age-related osteoporosis without current pathological fracture: Secondary | ICD-10-CM | POA: Diagnosis not present

## 2021-10-12 DIAGNOSIS — N189 Chronic kidney disease, unspecified: Secondary | ICD-10-CM | POA: Diagnosis not present

## 2021-10-17 DIAGNOSIS — I10 Essential (primary) hypertension: Secondary | ICD-10-CM | POA: Diagnosis not present

## 2021-10-17 DIAGNOSIS — N189 Chronic kidney disease, unspecified: Secondary | ICD-10-CM | POA: Diagnosis not present

## 2021-10-17 DIAGNOSIS — M810 Age-related osteoporosis without current pathological fracture: Secondary | ICD-10-CM | POA: Diagnosis not present

## 2021-10-17 DIAGNOSIS — I340 Nonrheumatic mitral (valve) insufficiency: Secondary | ICD-10-CM | POA: Diagnosis not present

## 2021-10-17 DIAGNOSIS — G43109 Migraine with aura, not intractable, without status migrainosus: Secondary | ICD-10-CM | POA: Diagnosis not present

## 2021-10-17 DIAGNOSIS — J301 Allergic rhinitis due to pollen: Secondary | ICD-10-CM | POA: Diagnosis not present

## 2021-10-17 DIAGNOSIS — E785 Hyperlipidemia, unspecified: Secondary | ICD-10-CM | POA: Diagnosis not present

## 2021-10-17 DIAGNOSIS — M5432 Sciatica, left side: Secondary | ICD-10-CM | POA: Diagnosis not present

## 2021-10-17 DIAGNOSIS — R69 Illness, unspecified: Secondary | ICD-10-CM | POA: Diagnosis not present

## 2021-10-17 DIAGNOSIS — M5136 Other intervertebral disc degeneration, lumbar region: Secondary | ICD-10-CM | POA: Diagnosis not present

## 2022-01-30 DIAGNOSIS — Z79899 Other long term (current) drug therapy: Secondary | ICD-10-CM | POA: Diagnosis not present

## 2022-01-30 DIAGNOSIS — Z87892 Personal history of anaphylaxis: Secondary | ICD-10-CM | POA: Diagnosis not present

## 2022-01-30 DIAGNOSIS — R69 Illness, unspecified: Secondary | ICD-10-CM | POA: Diagnosis not present

## 2022-01-30 DIAGNOSIS — Z883 Allergy status to other anti-infective agents status: Secondary | ICD-10-CM | POA: Diagnosis not present

## 2022-01-30 DIAGNOSIS — G8929 Other chronic pain: Secondary | ICD-10-CM | POA: Diagnosis not present

## 2022-01-30 DIAGNOSIS — J301 Allergic rhinitis due to pollen: Secondary | ICD-10-CM | POA: Diagnosis not present

## 2022-01-30 DIAGNOSIS — N1831 Chronic kidney disease, stage 3a: Secondary | ICD-10-CM | POA: Diagnosis not present

## 2022-01-30 DIAGNOSIS — Z7983 Long term (current) use of bisphosphonates: Secondary | ICD-10-CM | POA: Diagnosis not present

## 2022-01-30 DIAGNOSIS — M810 Age-related osteoporosis without current pathological fracture: Secondary | ICD-10-CM | POA: Diagnosis not present

## 2022-01-30 DIAGNOSIS — G309 Alzheimer's disease, unspecified: Secondary | ICD-10-CM | POA: Diagnosis not present

## 2022-01-30 DIAGNOSIS — Z87891 Personal history of nicotine dependence: Secondary | ICD-10-CM | POA: Diagnosis not present

## 2022-01-30 DIAGNOSIS — I951 Orthostatic hypotension: Secondary | ICD-10-CM | POA: Diagnosis not present

## 2022-02-11 DIAGNOSIS — E785 Hyperlipidemia, unspecified: Secondary | ICD-10-CM | POA: Diagnosis not present

## 2022-02-11 DIAGNOSIS — N189 Chronic kidney disease, unspecified: Secondary | ICD-10-CM | POA: Diagnosis not present

## 2022-02-12 DIAGNOSIS — J301 Allergic rhinitis due to pollen: Secondary | ICD-10-CM | POA: Diagnosis not present

## 2022-02-12 DIAGNOSIS — E785 Hyperlipidemia, unspecified: Secondary | ICD-10-CM | POA: Diagnosis not present

## 2022-02-12 DIAGNOSIS — R69 Illness, unspecified: Secondary | ICD-10-CM | POA: Diagnosis not present

## 2022-02-12 DIAGNOSIS — I340 Nonrheumatic mitral (valve) insufficiency: Secondary | ICD-10-CM | POA: Diagnosis not present

## 2022-02-12 DIAGNOSIS — G43109 Migraine with aura, not intractable, without status migrainosus: Secondary | ICD-10-CM | POA: Diagnosis not present

## 2022-02-12 DIAGNOSIS — I951 Orthostatic hypotension: Secondary | ICD-10-CM | POA: Diagnosis not present

## 2022-02-12 DIAGNOSIS — N189 Chronic kidney disease, unspecified: Secondary | ICD-10-CM | POA: Diagnosis not present

## 2022-02-12 DIAGNOSIS — I10 Essential (primary) hypertension: Secondary | ICD-10-CM | POA: Diagnosis not present

## 2022-02-12 DIAGNOSIS — M5136 Other intervertebral disc degeneration, lumbar region: Secondary | ICD-10-CM | POA: Diagnosis not present

## 2022-02-12 DIAGNOSIS — M810 Age-related osteoporosis without current pathological fracture: Secondary | ICD-10-CM | POA: Diagnosis not present

## 2022-02-12 DIAGNOSIS — M5432 Sciatica, left side: Secondary | ICD-10-CM | POA: Diagnosis not present

## 2022-04-17 DIAGNOSIS — N1831 Chronic kidney disease, stage 3a: Secondary | ICD-10-CM | POA: Diagnosis not present

## 2022-04-17 DIAGNOSIS — I10 Essential (primary) hypertension: Secondary | ICD-10-CM | POA: Diagnosis not present

## 2022-04-22 ENCOUNTER — Other Ambulatory Visit: Payer: Self-pay | Admitting: Internal Medicine

## 2022-04-22 DIAGNOSIS — Z1231 Encounter for screening mammogram for malignant neoplasm of breast: Secondary | ICD-10-CM

## 2022-05-21 ENCOUNTER — Ambulatory Visit
Admission: RE | Admit: 2022-05-21 | Discharge: 2022-05-21 | Disposition: A | Discharge status: Home / Self Care | Payer: Medicare HMO | Source: Ambulatory Visit | Attending: Internal Medicine | Admitting: Internal Medicine

## 2022-05-21 DIAGNOSIS — Z1231 Encounter for screening mammogram for malignant neoplasm of breast: Secondary | ICD-10-CM | POA: Insufficient documentation

## 2022-09-03 ENCOUNTER — Other Ambulatory Visit: Payer: Self-pay | Admitting: Internal Medicine

## 2022-09-05 ENCOUNTER — Other Ambulatory Visit: Payer: Self-pay | Admitting: Internal Medicine

## 2022-09-06 NOTE — Telephone Encounter (Signed)
Patient sent msg via portal to make appt

## 2022-09-21 ENCOUNTER — Other Ambulatory Visit: Payer: Self-pay | Admitting: Internal Medicine

## 2022-09-24 ENCOUNTER — Other Ambulatory Visit: Payer: Self-pay

## 2022-09-26 MED ORDER — IBANDRONATE SODIUM 150 MG PO TABS: 150.0000 mg | ORAL_TABLET | ORAL | 3 refills | Status: DC

## 2022-10-19 ENCOUNTER — Other Ambulatory Visit: Payer: Self-pay | Admitting: Internal Medicine

## 2022-11-21 ENCOUNTER — Other Ambulatory Visit: Payer: Self-pay | Admitting: Internal Medicine

## 2022-11-27 ENCOUNTER — Telehealth: Payer: Self-pay | Admitting: Internal Medicine

## 2022-11-27 NOTE — Telephone Encounter (Signed)
Patient's husband left VM that patient needs some meds sent in before they leave for [place] next week but didn't state which meds.

## 2022-11-27 NOTE — Telephone Encounter (Signed)
She is currently in [facility] actually so please send to her pharmacy there.

## 2022-12-11 ENCOUNTER — Telehealth: Payer: Self-pay

## 2022-12-11 NOTE — Telephone Encounter (Signed)
Cindy Patel called and left vm requesting a call back regarding pt, said he was returning our call regarding rx. Said pt is on losartan 50mg , [facility] faxed for authorization for refills?

## 2022-12-18 ENCOUNTER — Other Ambulatory Visit: Payer: Self-pay | Admitting: Internal Medicine

## 2022-12-18 MED ORDER — LOSARTAN POTASSIUM 50 MG PO TABS: 50.0000 mg | ORAL_TABLET | Freq: Every day | ORAL | 0 refills | Status: DC

## 2022-12-18 MED ORDER — IBANDRONATE SODIUM 150 MG PO TABS: 150.0000 mg | ORAL_TABLET | ORAL | 0 refills | Status: DC

## 2023-01-02 ENCOUNTER — Other Ambulatory Visit: Payer: Self-pay | Admitting: Internal Medicine

## 2023-01-02 ENCOUNTER — Other Ambulatory Visit: Payer: Medicare HMO

## 2023-01-02 DIAGNOSIS — E785 Hyperlipidemia, unspecified: Secondary | ICD-10-CM | POA: Diagnosis not present

## 2023-01-02 DIAGNOSIS — N189 Chronic kidney disease, unspecified: Secondary | ICD-10-CM | POA: Diagnosis not present

## 2023-01-03 LAB — COMPREHENSIVE METABOLIC PANEL
ALT: 15 IU/L (ref 0–32)
AST: 24 IU/L (ref 0–40)
Albumin/Globulin Ratio: 2 (ref 1.2–2.2)
Albumin: 4.3 g/dL (ref 3.8–4.8)
Alkaline Phosphatase: 133 IU/L — ABNORMAL HIGH (ref 44–121)
BUN/Creatinine Ratio: 19 (ref 12–28)
BUN: 21 mg/dL (ref 8–27)
Bilirubin Total: 0.4 mg/dL (ref 0.0–1.2)
CO2: 26 mmol/L (ref 20–29)
Calcium: 9.6 mg/dL (ref 8.7–10.3)
Chloride: 105 mmol/L (ref 96–106)
Creatinine, Ser: 1.08 mg/dL — ABNORMAL HIGH (ref 0.57–1.00)
Globulin, Total: 2.2 g/dL (ref 1.5–4.5)
Glucose: 87 mg/dL (ref 70–99)
Potassium: 4.2 mmol/L (ref 3.5–5.2)
Sodium: 143 mmol/L (ref 134–144)
Total Protein: 6.5 g/dL (ref 6.0–8.5)
eGFR: 53 mL/min/{1.73_m2} — ABNORMAL LOW (ref >59)

## 2023-01-03 LAB — LIPID PANEL W/O CHOL/HDL RATIO
Cholesterol, Total: 210 mg/dL — ABNORMAL HIGH (ref 100–199)
HDL: 79 mg/dL (ref >39)
LDL Chol Calc (NIH): 107 mg/dL — ABNORMAL HIGH (ref 0–99)
Triglycerides: 140 mg/dL (ref 0–149)
VLDL Cholesterol Cal: 24 mg/dL (ref 5–40)

## 2023-01-04 ENCOUNTER — Other Ambulatory Visit: Payer: Self-pay | Admitting: Internal Medicine

## 2023-01-07 ENCOUNTER — Encounter: Payer: Self-pay | Admitting: Internal Medicine

## 2023-01-07 ENCOUNTER — Ambulatory Visit (INDEPENDENT_AMBULATORY_CARE_PROVIDER_SITE_OTHER): Payer: Medicare HMO | Admitting: Internal Medicine

## 2023-01-07 VITALS — BP 140/84 | HR 69 | Ht 62.0 in | Wt 119.4 lb

## 2023-01-07 DIAGNOSIS — E782 Mixed hyperlipidemia: Secondary | ICD-10-CM | POA: Diagnosis not present

## 2023-01-07 DIAGNOSIS — G308 Other Alzheimer's disease: Secondary | ICD-10-CM | POA: Diagnosis not present

## 2023-01-07 DIAGNOSIS — F02B0 Dementia in other diseases classified elsewhere, moderate, without behavioral disturbance, psychotic disturbance, mood disturbance, and anxiety: Secondary | ICD-10-CM

## 2023-01-07 DIAGNOSIS — M810 Age-related osteoporosis without current pathological fracture: Secondary | ICD-10-CM

## 2023-01-07 DIAGNOSIS — F0280 Dementia in other diseases classified elsewhere without behavioral disturbance: Secondary | ICD-10-CM | POA: Insufficient documentation

## 2023-01-07 MED ORDER — IBANDRONATE SODIUM 150 MG PO TABS: 150.0000 mg | ORAL_TABLET | ORAL | 1 refills | Status: DC

## 2023-01-07 MED ORDER — MEMANTINE HCL 10 MG PO TABS: 10.0000 mg | ORAL_TABLET | Freq: Every morning | ORAL | 0 refills | Status: DC

## 2023-01-07 MED ORDER — DONEPEZIL HCL 10 MG PO TABS
10.0000 mg | ORAL_TABLET | Freq: Every morning | ORAL | 0 refills | Status: DC
Start: 2023-01-07 — End: 2023-03-18

## 2023-01-07 NOTE — Progress Notes (Signed)
Established Patient Office Visit  Subjective:  Patient ID: Cindy Patel, female    DOB: [birth year]  Age: 77 y.o. MRN: [id number]  Chief Complaint  Patient presents with   Follow-up    Follow up    No new complaints, here for lab review and medication refills. Missed a few doses of her Losartan over the last few days with attendant elevated bp today. Worsening behaviour per husband mainly more suspicious.    No other concerns at this time.   Past Medical History:  Diagnosis Date   Alzheimer'Feige Lowdermilk dementia (HCC)    Hyperlipidemia    Hypertension    Lumbar stress fracture    Mitral regurgitation    Urolithiasis     Past Surgical History:  Procedure Laterality Date   ABDOMINAL HYSTERECTOMY     CHOLECYSTECTOMY      Social History   Socioeconomic History   Marital status: Married    Spouse name: Not on file   Number of children: Not on file   Years of education: Not on file   Highest education level: Not on file  Occupational History   Occupation: Retired  Tobacco Use   Smoking status: Never   Smokeless tobacco: Not on file  Vaping Use   Vaping Use: Never used  Substance and Sexual Activity   Alcohol use: Yes   Drug use: Never   Sexual activity: Yes  Other Topics Concern   Not on file  Social History Narrative   Not on file   Social Determinants of Health   Financial Resource Strain: Not on file  Food Insecurity: Not on file  Transportation Needs: Not on file  Physical Activity: Not on file  Stress: Not on file  Social Connections: Not on file  Intimate Partner Violence: Not on file    Family History  Problem Relation Age of Onset   Breast cancer Neg Hx     Allergies  Allergen Reactions   Iodine Rash    Review of Systems  Constitutional: Negative.   HENT: Negative.    Eyes: Negative.   Respiratory: Negative.    Cardiovascular: Negative.   Gastrointestinal: Negative.   Genitourinary: Negative.   Skin: Negative.   Neurological:  Negative.   Endo/Heme/Allergies: Negative.   Psychiatric/Behavioral:  Positive for memory loss.        Objective:   BP (!) 140/84   Pulse 69   Ht 5\' 2"  (1.575 m)   Wt 119 lb 6.4 oz (54.2 kg)   SpO2 98%   BMI 21.84 kg/m   Vitals:   01/07/23 1437  BP: (!) 140/84  Pulse: 69  Height: 5\' 2"  (1.575 m)  Weight: 119 lb 6.4 oz (54.2 kg)  SpO2: 98%  BMI (Calculated): 21.83    Physical Exam Vitals reviewed.  Constitutional:      General: She is not in acute distress. HENT:     Head: Normocephalic.     Nose: Nose normal.     Mouth/Throat:     Mouth: Mucous membranes are moist.  Eyes:     Extraocular Movements: Extraocular movements intact.     Pupils: Pupils are equal, round, and reactive to light.  Cardiovascular:     Rate and Rhythm: Normal rate and regular rhythm.     Heart sounds: No murmur heard. Pulmonary:     Effort: Pulmonary effort is normal.     Breath sounds: No rhonchi or rales.  Abdominal:     General: Abdomen is flat.  Palpations: There is no hepatomegaly, splenomegaly or mass.  Musculoskeletal:        General: Normal range of motion.     Cervical back: Normal range of motion. No tenderness.  Skin:    General: Skin is warm and dry.  Neurological:     General: No focal deficit present.     Mental Status: She is alert and oriented to person, place, and time.     Cranial Nerves: No cranial nerve deficit.     Motor: No weakness.  Psychiatric:        Mood and Affect: Mood normal.        Behavior: Behavior normal.      No results found for any visits on 01/07/23.  Recent Results (from the past 2160 hour(Zalma Channing))  Comprehensive metabolic panel     Status: Abnormal   Collection Time: 01/02/23  9:42 AM  Result Value Ref Range   Glucose 87 70 - 99 mg/dL   BUN 21 8 - 27 mg/dL   Creatinine, Ser 2.95 (H) 0.57 - 1.00 mg/dL   eGFR 53 (L) >62 ZH/YQM/5.78   BUN/Creatinine Ratio 19 12 - 28   Sodium 143 134 - 144 mmol/L   Potassium 4.2 3.5 - 5.2 mmol/L    Chloride 105 96 - 106 mmol/L   CO2 26 20 - 29 mmol/L   Calcium 9.6 8.7 - 10.3 mg/dL   Total Protein 6.5 6.0 - 8.5 g/dL   Albumin 4.3 3.8 - 4.8 g/dL   Globulin, Total 2.2 1.5 - 4.5 g/dL   Albumin/Globulin Ratio 2.0 1.2 - 2.2   Bilirubin Total 0.4 0.0 - 1.2 mg/dL   Alkaline Phosphatase 133 (H) 44 - 121 IU/L   AST 24 0 - 40 IU/L   ALT 15 0 - 32 IU/L  Lipid Panel w/o Chol/HDL Ratio     Status: Abnormal   Collection Time: 01/02/23  9:42 AM  Result Value Ref Range   Cholesterol, Total 210 (H) 100 - 199 mg/dL   Triglycerides 469 0 - 149 mg/dL   HDL 79 >62 mg/dL   VLDL Cholesterol Cal 24 5 - 40 mg/dL   LDL Chol Calc (NIH) 952 (H) 0 - 99 mg/dL      Assessment & Plan:  As per problem list  Problem List Items Addressed This Visit       Nervous and Auditory   DAT (dementia of Alzheimer type) (HCC)   Relevant Medications   memantine (NAMENDA) 10 MG tablet   donepezil (ARICEPT) 10 MG tablet     Musculoskeletal and Integument   Age-related osteoporosis without current pathological fracture - Primary   Relevant Medications   ibandronate (BONIVA) 150 MG tablet     Other   Moderate mixed hyperlipidemia not requiring statin therapy   Relevant Orders   CBC With Diff/Platelet   Comprehensive metabolic panel   Lipid panel    Return in about 3 months (around 04/09/2023) for awv with labs prior.   Total time spent: 20 minutes  Luna Fuse, MD  01/07/2023   This document may have been prepared by [facility] Voice Recognition software and as such may include unintentional dictation errors.

## 2023-01-08 ENCOUNTER — Other Ambulatory Visit: Payer: Self-pay | Admitting: Internal Medicine

## 2023-01-14 DIAGNOSIS — N1831 Chronic kidney disease, stage 3a: Secondary | ICD-10-CM | POA: Diagnosis not present

## 2023-01-14 DIAGNOSIS — I129 Hypertensive chronic kidney disease with stage 1 through stage 4 chronic kidney disease, or unspecified chronic kidney disease: Secondary | ICD-10-CM | POA: Diagnosis not present

## 2023-01-17 ENCOUNTER — Other Ambulatory Visit: Payer: Self-pay | Admitting: Internal Medicine

## 2023-01-17 DIAGNOSIS — G308 Other Alzheimer's disease: Secondary | ICD-10-CM

## 2023-01-20 NOTE — Telephone Encounter (Signed)
Patient is not down there at this time

## 2023-03-15 ENCOUNTER — Other Ambulatory Visit: Payer: Self-pay | Admitting: Internal Medicine

## 2023-03-15 DIAGNOSIS — F02B0 Dementia in other diseases classified elsewhere, moderate, without behavioral disturbance, psychotic disturbance, mood disturbance, and anxiety: Secondary | ICD-10-CM

## 2023-03-19 DIAGNOSIS — H52223 Regular astigmatism, bilateral: Secondary | ICD-10-CM | POA: Diagnosis not present

## 2023-03-19 DIAGNOSIS — H5203 Hypermetropia, bilateral: Secondary | ICD-10-CM | POA: Diagnosis not present

## 2023-03-19 DIAGNOSIS — H524 Presbyopia: Secondary | ICD-10-CM | POA: Diagnosis not present

## 2023-04-04 DIAGNOSIS — J301 Allergic rhinitis due to pollen: Secondary | ICD-10-CM | POA: Diagnosis not present

## 2023-04-04 DIAGNOSIS — E785 Hyperlipidemia, unspecified: Secondary | ICD-10-CM | POA: Diagnosis not present

## 2023-04-04 DIAGNOSIS — M1990 Unspecified osteoarthritis, unspecified site: Secondary | ICD-10-CM | POA: Diagnosis not present

## 2023-04-04 DIAGNOSIS — Z008 Encounter for other general examination: Secondary | ICD-10-CM | POA: Diagnosis not present

## 2023-04-04 DIAGNOSIS — F0280 Dementia in other diseases classified elsewhere without behavioral disturbance: Secondary | ICD-10-CM | POA: Diagnosis not present

## 2023-04-04 DIAGNOSIS — M5430 Sciatica, unspecified side: Secondary | ICD-10-CM | POA: Diagnosis not present

## 2023-04-04 DIAGNOSIS — R32 Unspecified urinary incontinence: Secondary | ICD-10-CM | POA: Diagnosis not present

## 2023-04-04 DIAGNOSIS — I129 Hypertensive chronic kidney disease with stage 1 through stage 4 chronic kidney disease, or unspecified chronic kidney disease: Secondary | ICD-10-CM | POA: Diagnosis not present

## 2023-04-04 DIAGNOSIS — M810 Age-related osteoporosis without current pathological fracture: Secondary | ICD-10-CM | POA: Diagnosis not present

## 2023-04-04 DIAGNOSIS — G309 Alzheimer's disease, unspecified: Secondary | ICD-10-CM | POA: Diagnosis not present

## 2023-04-04 DIAGNOSIS — Z87891 Personal history of nicotine dependence: Secondary | ICD-10-CM | POA: Diagnosis not present

## 2023-04-04 DIAGNOSIS — Z87892 Personal history of anaphylaxis: Secondary | ICD-10-CM | POA: Diagnosis not present

## 2023-04-04 DIAGNOSIS — Z818 Family history of other mental and behavioral disorders: Secondary | ICD-10-CM | POA: Diagnosis not present

## 2023-04-10 ENCOUNTER — Other Ambulatory Visit: Payer: Medicare HMO

## 2023-04-10 DIAGNOSIS — E782 Mixed hyperlipidemia: Secondary | ICD-10-CM | POA: Diagnosis not present

## 2023-04-11 LAB — COMPREHENSIVE METABOLIC PANEL
ALT: 17 IU/L (ref 0–32)
AST: 26 IU/L (ref 0–40)
Albumin: 4.2 g/dL (ref 3.8–4.8)
Alkaline Phosphatase: 152 IU/L — ABNORMAL HIGH (ref 44–121)
BUN/Creatinine Ratio: 18 (ref 12–28)
BUN: 21 mg/dL (ref 8–27)
Bilirubin Total: 0.3 mg/dL (ref 0.0–1.2)
CO2: 26 mmol/L (ref 20–29)
Calcium: 9.5 mg/dL (ref 8.7–10.3)
Chloride: 104 mmol/L (ref 96–106)
Creatinine, Ser: 1.18 mg/dL — ABNORMAL HIGH (ref 0.57–1.00)
Globulin, Total: 2.6 g/dL (ref 1.5–4.5)
Glucose: 85 mg/dL (ref 70–99)
Potassium: 4.3 mmol/L (ref 3.5–5.2)
Sodium: 143 mmol/L (ref 134–144)
Total Protein: 6.8 g/dL (ref 6.0–8.5)
eGFR: 48 mL/min/{1.73_m2} — ABNORMAL LOW (ref >59)

## 2023-04-11 LAB — LIPID PANEL
Chol/HDL Ratio: 2.8 ratio (ref 0.0–4.4)
Cholesterol, Total: 218 mg/dL — ABNORMAL HIGH (ref 100–199)
HDL: 79 mg/dL (ref >39)
LDL Chol Calc (NIH): 121 mg/dL — ABNORMAL HIGH (ref 0–99)
Triglycerides: 103 mg/dL (ref 0–149)
VLDL Cholesterol Cal: 18 mg/dL (ref 5–40)

## 2023-04-11 LAB — CBC WITH DIFF/PLATELET
Basophils Absolute: 0 10*3/uL (ref 0.0–0.2)
Basos: 0 %
EOS (ABSOLUTE): 0.2 10*3/uL (ref 0.0–0.4)
Eos: 3 %
Hematocrit: 40.5 % (ref 34.0–46.6)
Hemoglobin: 13.3 g/dL (ref 11.1–15.9)
Immature Grans (Abs): 0 10*3/uL (ref 0.0–0.1)
Immature Granulocytes: 0 %
Lymphocytes Absolute: 1 10*3/uL (ref 0.7–3.1)
Lymphs: 16 %
MCH: 30.2 pg (ref 26.6–33.0)
MCHC: 32.8 g/dL (ref 31.5–35.7)
MCV: 92 fL (ref 79–97)
Monocytes Absolute: 0.5 10*3/uL (ref 0.1–0.9)
Monocytes: 9 %
Neutrophils Absolute: 4.2 10*3/uL (ref 1.4–7.0)
Neutrophils: 72 %
Platelets: 187 10*3/uL (ref 150–450)
RBC: 4.4 x10E6/uL (ref 3.77–5.28)
RDW: 13.1 % (ref 11.7–15.4)
WBC: 5.9 10*3/uL (ref 3.4–10.8)

## 2023-04-14 ENCOUNTER — Ambulatory Visit (INDEPENDENT_AMBULATORY_CARE_PROVIDER_SITE_OTHER): Payer: Medicare HMO | Admitting: Internal Medicine

## 2023-04-14 ENCOUNTER — Encounter: Payer: Self-pay | Admitting: Internal Medicine

## 2023-04-14 VITALS — BP 123/62 | HR 58 | Ht 63.0 in | Wt 121.0 lb

## 2023-04-14 DIAGNOSIS — M810 Age-related osteoporosis without current pathological fracture: Secondary | ICD-10-CM | POA: Diagnosis not present

## 2023-04-14 DIAGNOSIS — F02B0 Dementia in other diseases classified elsewhere, moderate, without behavioral disturbance, psychotic disturbance, mood disturbance, and anxiety: Secondary | ICD-10-CM

## 2023-04-14 DIAGNOSIS — N1831 Chronic kidney disease, stage 3a: Secondary | ICD-10-CM

## 2023-04-14 DIAGNOSIS — Z1331 Encounter for screening for depression: Secondary | ICD-10-CM | POA: Diagnosis not present

## 2023-04-14 DIAGNOSIS — G308 Other Alzheimer's disease: Secondary | ICD-10-CM

## 2023-04-14 DIAGNOSIS — I10 Essential (primary) hypertension: Secondary | ICD-10-CM

## 2023-04-14 DIAGNOSIS — Z0001 Encounter for general adult medical examination with abnormal findings: Secondary | ICD-10-CM | POA: Diagnosis not present

## 2023-04-14 DIAGNOSIS — E782 Mixed hyperlipidemia: Secondary | ICD-10-CM | POA: Diagnosis not present

## 2023-04-14 MED ORDER — LOSARTAN POTASSIUM 50 MG PO TABS
50.0000 mg | ORAL_TABLET | Freq: Every day | ORAL | 0 refills | Status: DC
Start: 2023-04-14 — End: 2023-05-26

## 2023-04-14 NOTE — Progress Notes (Signed)
Established Patient Office Visit  Subjective:  Patient ID: Cindy Patel, female    DOB: 1946/05/19  Age: 77 y.o. MRN: [id number]  No chief complaint on file.   No new complaints, here for AWV refer to quality metrics and scanned documents.   Labs reviewed and notable for deteriorating renal function, deterioration in LDL and TC to which she admits dietary indiscretion.  No other concerns at this time.   Past Medical History:  Diagnosis Date   Alzheimer'Emonee Winkowski dementia (HCC)    Hyperlipidemia    Hypertension    Lumbar stress fracture    Mitral regurgitation    Urolithiasis     Past Surgical History:  Procedure Laterality Date   ABDOMINAL HYSTERECTOMY     CHOLECYSTECTOMY      Social History   Socioeconomic History   Marital status: Married    Spouse name: Not on file   Number of children: Not on file   Years of education: Not on file   Highest education level: Not on file  Occupational History   Occupation: Retired  Tobacco Use   Smoking status: Never   Smokeless tobacco: Not on file  Vaping Use   Vaping status: Never Used  Substance and Sexual Activity   Alcohol use: Yes   Drug use: Never   Sexual activity: Yes  Other Topics Concern   Not on file  Social History Narrative   Not on file   Social Determinants of Health   Financial Resource Strain: Not on file  Food Insecurity: Not on file  Transportation Needs: Not on file  Physical Activity: Not on file  Stress: Not on file  Social Connections: Not on file  Intimate Partner Violence: Not on file    Family History  Problem Relation Age of Onset   Breast cancer Neg Hx     Allergies  Allergen Reactions   Iodine Rash    Review of Systems  Constitutional: Negative.   HENT: Negative.    Eyes: Negative.   Respiratory: Negative.    Cardiovascular: Negative.   Gastrointestinal: Negative.   Genitourinary: Negative.   Skin: Negative.   Neurological: Negative.   Endo/Heme/Allergies: Negative.    Psychiatric/Behavioral:  Positive for memory loss.        Objective:   BP 123/62   Pulse (!) 58   Ht 5\' 3"  (1.6 m)   Wt 121 lb (54.9 kg)   SpO2 98%   BMI 21.43 kg/m   Vitals:   04/14/23 1306  BP: 123/62  Pulse: (!) 58  Height: 5\' 3"  (1.6 m)  Weight: 121 lb (54.9 kg)  SpO2: 98%  BMI (Calculated): 21.44    Physical Exam Vitals reviewed.  Constitutional:      General: She is not in acute distress. HENT:     Head: Normocephalic.     Nose: Nose normal.     Mouth/Throat:     Mouth: Mucous membranes are moist.  Eyes:     Extraocular Movements: Extraocular movements intact.     Pupils: Pupils are equal, round, and reactive to light.  Cardiovascular:     Rate and Rhythm: Normal rate and regular rhythm.     Heart sounds: No murmur heard. Pulmonary:     Effort: Pulmonary effort is normal.     Breath sounds: No rhonchi or rales.  Abdominal:     General: Abdomen is flat.     Palpations: There is no hepatomegaly, splenomegaly or mass.  Musculoskeletal:  General: Normal range of motion.     Cervical back: Normal range of motion. No tenderness.  Skin:    General: Skin is warm and dry.  Neurological:     General: No focal deficit present.     Mental Status: She is alert and oriented to person, place, and time.     Cranial Nerves: No cranial nerve deficit.     Motor: No weakness.     Deep Tendon Reflexes:     Reflex Scores:      Tricep reflexes are 4+ on the right side and 4+ on the left side.      Bicep reflexes are 4+ on the right side and 4+ on the left side.      Brachioradialis reflexes are 4+ on the right side and 4+ on the left side.      Patellar reflexes are 4+ on the right side and 4+ on the left side. Psychiatric:        Mood and Affect: Mood normal.        Behavior: Behavior normal.      No results found for any visits on 04/14/23.  Recent Results (from the past 2160 hour(Ahlijah Raia))  Lipid panel     Status: Abnormal   Collection Time: 04/10/23 10:15  AM  Result Value Ref Range   Cholesterol, Total 218 (H) 100 - 199 mg/dL   Triglycerides 644 0 - 149 mg/dL   HDL 79 >03 mg/dL   VLDL Cholesterol Cal 18 5 - 40 mg/dL   LDL Chol Calc (NIH) 474 (H) 0 - 99 mg/dL   Chol/HDL Ratio 2.8 0.0 - 4.4 ratio    Comment:                                   T. Chol/HDL Ratio                                             Men  Women                               1/2 Avg.Risk  3.4    3.3                                   Avg.Risk  5.0    4.4                                2X Avg.Risk  9.6    7.1                                3X Avg.Risk 23.4   11.0   Comprehensive metabolic panel     Status: Abnormal   Collection Time: 04/10/23 10:15 AM  Result Value Ref Range   Glucose 85 70 - 99 mg/dL   BUN 21 8 - 27 mg/dL   Creatinine, Ser 2.59 (H) 0.57 - 1.00 mg/dL   eGFR 48 (L) >56 LO/VFI/4.33   BUN/Creatinine Ratio 18 12 - 28   Sodium 143 134 - 144 mmol/L   Potassium  4.3 3.5 - 5.2 mmol/L   Chloride 104 96 - 106 mmol/L   CO2 26 20 - 29 mmol/L   Calcium 9.5 8.7 - 10.3 mg/dL   Total Protein 6.8 6.0 - 8.5 g/dL   Albumin 4.2 3.8 - 4.8 g/dL   Globulin, Total 2.6 1.5 - 4.5 g/dL   Bilirubin Total 0.3 0.0 - 1.2 mg/dL   Alkaline Phosphatase 152 (H) 44 - 121 IU/L   AST 26 0 - 40 IU/L   ALT 17 0 - 32 IU/L  CBC With Diff/Platelet     Status: None   Collection Time: 04/10/23 10:15 AM  Result Value Ref Range   WBC 5.9 3.4 - 10.8 x10E3/uL   RBC 4.40 3.77 - 5.28 x10E6/uL   Hemoglobin 13.3 11.1 - 15.9 g/dL   Hematocrit 02.7 25.3 - 46.6 %   MCV 92 79 - 97 fL   MCH 30.2 26.6 - 33.0 pg   MCHC 32.8 31.5 - 35.7 g/dL   RDW 66.4 40.3 - 47.4 %   Platelets 187 150 - 450 x10E3/uL   Neutrophils 72 Not Estab. %   Lymphs 16 Not Estab. %   Monocytes 9 Not Estab. %   Eos 3 Not Estab. %   Basos 0 Not Estab. %   Neutrophils Absolute 4.2 1.4 - 7.0 x10E3/uL   Lymphocytes Absolute 1.0 0.7 - 3.1 x10E3/uL   Monocytes Absolute 0.5 0.1 - 0.9 x10E3/uL   EOS (ABSOLUTE) 0.2 0.0 - 0.4  x10E3/uL   Basophils Absolute 0.0 0.0 - 0.2 x10E3/uL   Immature Granulocytes 0 Not Estab. %   Immature Grans (Abs) 0.0 0.0 - 0.1 x10E3/uL      Assessment & Plan:  As per problem list. H/o A;zheimer'Amarii Amy therefore cognitive screen not performed.  Problem List Items Addressed This Visit       Nervous and Auditory   DAT (dementia of Alzheimer type) (HCC)     Musculoskeletal and Integument   Age-related osteoporosis without current pathological fracture - Primary     Other   Moderate mixed hyperlipidemia not requiring statin therapy   Other Visit Diagnoses     CKD stage 3a, GFR 45-59 ml/min (HCC)           Return in about 6 months (around 10/12/2023) for fu with labs prior.   Total time spent: 40 minutes  Luna Fuse, MD  04/14/2023   This document may have been prepared by [facility] Voice Recognition software and as such may include unintentional dictation errors.

## 2023-04-22 ENCOUNTER — Other Ambulatory Visit: Payer: Self-pay | Admitting: Internal Medicine

## 2023-04-22 DIAGNOSIS — Z1231 Encounter for screening mammogram for malignant neoplasm of breast: Secondary | ICD-10-CM

## 2023-04-30 DIAGNOSIS — Z0100 Encounter for examination of eyes and vision without abnormal findings: Secondary | ICD-10-CM | POA: Diagnosis not present

## 2023-05-07 DIAGNOSIS — H6121 Impacted cerumen, right ear: Secondary | ICD-10-CM | POA: Diagnosis not present

## 2023-05-07 DIAGNOSIS — H903 Sensorineural hearing loss, bilateral: Secondary | ICD-10-CM | POA: Diagnosis not present

## 2023-05-15 DIAGNOSIS — N1831 Chronic kidney disease, stage 3a: Secondary | ICD-10-CM | POA: Diagnosis not present

## 2023-05-15 DIAGNOSIS — I129 Hypertensive chronic kidney disease with stage 1 through stage 4 chronic kidney disease, or unspecified chronic kidney disease: Secondary | ICD-10-CM | POA: Diagnosis not present

## 2023-05-23 ENCOUNTER — Ambulatory Visit
Admission: RE | Admit: 2023-05-23 | Discharge: 2023-05-23 | Disposition: A | Discharge status: Home / Self Care | Payer: Medicare HMO | Source: Ambulatory Visit | Attending: Internal Medicine | Admitting: Internal Medicine

## 2023-05-23 DIAGNOSIS — Z1231 Encounter for screening mammogram for malignant neoplasm of breast: Secondary | ICD-10-CM | POA: Diagnosis not present

## 2023-05-26 ENCOUNTER — Other Ambulatory Visit: Payer: Self-pay | Admitting: Internal Medicine

## 2023-05-26 DIAGNOSIS — I10 Essential (primary) hypertension: Secondary | ICD-10-CM

## 2023-05-26 MED ORDER — LOSARTAN POTASSIUM 50 MG PO TABS: 50.0000 mg | ORAL_TABLET | Freq: Every day | ORAL | 0 refills | Status: DC

## 2023-07-10 ENCOUNTER — Other Ambulatory Visit: Payer: Self-pay | Admitting: Internal Medicine

## 2023-07-10 DIAGNOSIS — M810 Age-related osteoporosis without current pathological fracture: Secondary | ICD-10-CM

## 2023-07-10 DIAGNOSIS — G308 Other Alzheimer's disease: Secondary | ICD-10-CM

## 2023-07-11 MED ORDER — IBANDRONATE SODIUM 150 MG PO TABS: 150.0000 mg | ORAL_TABLET | ORAL | 1 refills | Status: DC

## 2023-08-31 ENCOUNTER — Other Ambulatory Visit: Payer: Self-pay | Admitting: Internal Medicine

## 2023-08-31 DIAGNOSIS — I10 Essential (primary) hypertension: Secondary | ICD-10-CM

## 2023-09-03 ENCOUNTER — Other Ambulatory Visit: Payer: Self-pay | Admitting: Internal Medicine

## 2023-09-03 DIAGNOSIS — I10 Essential (primary) hypertension: Secondary | ICD-10-CM

## 2023-09-03 DIAGNOSIS — F02B0 Dementia in other diseases classified elsewhere, moderate, without behavioral disturbance, psychotic disturbance, mood disturbance, and anxiety: Secondary | ICD-10-CM

## 2023-10-04 ENCOUNTER — Other Ambulatory Visit: Payer: Self-pay | Admitting: Internal Medicine

## 2023-10-04 DIAGNOSIS — F02B0 Dementia in other diseases classified elsewhere, moderate, without behavioral disturbance, psychotic disturbance, mood disturbance, and anxiety: Secondary | ICD-10-CM

## 2023-10-06 ENCOUNTER — Other Ambulatory Visit: Payer: Self-pay | Admitting: Internal Medicine

## 2023-10-06 DIAGNOSIS — G308 Other Alzheimer's disease: Secondary | ICD-10-CM

## 2023-10-13 ENCOUNTER — Ambulatory Visit: Payer: Medicare HMO | Admitting: Internal Medicine

## 2023-11-07 DIAGNOSIS — E612 Magnesium deficiency: Secondary | ICD-10-CM | POA: Diagnosis not present

## 2023-11-07 DIAGNOSIS — I10 Essential (primary) hypertension: Secondary | ICD-10-CM | POA: Diagnosis not present

## 2023-11-07 DIAGNOSIS — D519 Vitamin B12 deficiency anemia, unspecified: Secondary | ICD-10-CM | POA: Diagnosis not present

## 2023-11-07 DIAGNOSIS — R4189 Other symptoms and signs involving cognitive functions and awareness: Secondary | ICD-10-CM | POA: Diagnosis not present

## 2023-11-07 DIAGNOSIS — Z148 Genetic carrier of other disease: Secondary | ICD-10-CM | POA: Diagnosis not present

## 2023-11-07 DIAGNOSIS — E785 Hyperlipidemia, unspecified: Secondary | ICD-10-CM | POA: Diagnosis not present

## 2023-11-07 DIAGNOSIS — E559 Vitamin D deficiency, unspecified: Secondary | ICD-10-CM | POA: Diagnosis not present

## 2023-11-07 DIAGNOSIS — G3184 Mild cognitive impairment, so stated: Secondary | ICD-10-CM | POA: Diagnosis not present

## 2023-11-07 DIAGNOSIS — D539 Nutritional anemia, unspecified: Secondary | ICD-10-CM | POA: Diagnosis not present

## 2023-11-07 DIAGNOSIS — R7879 Finding of abnormal level of heavy metals in blood: Secondary | ICD-10-CM | POA: Diagnosis not present

## 2023-11-07 DIAGNOSIS — R7303 Prediabetes: Secondary | ICD-10-CM | POA: Diagnosis not present

## 2023-12-10 DIAGNOSIS — N189 Chronic kidney disease, unspecified: Secondary | ICD-10-CM | POA: Diagnosis not present

## 2023-12-10 DIAGNOSIS — E785 Hyperlipidemia, unspecified: Secondary | ICD-10-CM | POA: Diagnosis not present

## 2023-12-10 DIAGNOSIS — E559 Vitamin D deficiency, unspecified: Secondary | ICD-10-CM | POA: Diagnosis not present

## 2023-12-10 DIAGNOSIS — R7303 Prediabetes: Secondary | ICD-10-CM | POA: Diagnosis not present

## 2023-12-10 DIAGNOSIS — D519 Vitamin B12 deficiency anemia, unspecified: Secondary | ICD-10-CM | POA: Diagnosis not present

## 2023-12-10 DIAGNOSIS — I10 Essential (primary) hypertension: Secondary | ICD-10-CM | POA: Diagnosis not present

## 2023-12-10 DIAGNOSIS — N951 Menopausal and female climacteric states: Secondary | ICD-10-CM | POA: Diagnosis not present

## 2023-12-10 DIAGNOSIS — G3184 Mild cognitive impairment, so stated: Secondary | ICD-10-CM | POA: Diagnosis not present

## 2023-12-10 DIAGNOSIS — R4189 Other symptoms and signs involving cognitive functions and awareness: Secondary | ICD-10-CM | POA: Diagnosis not present

## 2023-12-10 DIAGNOSIS — E612 Magnesium deficiency: Secondary | ICD-10-CM | POA: Diagnosis not present

## 2023-12-10 DIAGNOSIS — R7879 Finding of abnormal level of heavy metals in blood: Secondary | ICD-10-CM | POA: Diagnosis not present

## 2023-12-10 DIAGNOSIS — I499 Cardiac arrhythmia, unspecified: Secondary | ICD-10-CM | POA: Diagnosis not present

## 2023-12-12 ENCOUNTER — Other Ambulatory Visit

## 2023-12-12 DIAGNOSIS — N1831 Chronic kidney disease, stage 3a: Secondary | ICD-10-CM | POA: Diagnosis not present

## 2023-12-12 DIAGNOSIS — E782 Mixed hyperlipidemia: Secondary | ICD-10-CM | POA: Diagnosis not present

## 2023-12-13 LAB — COMPREHENSIVE METABOLIC PANEL WITH GFR
ALT: 13 IU/L (ref 0–32)
AST: 20 IU/L (ref 0–40)
Albumin: 4.2 g/dL (ref 3.8–4.8)
Alkaline Phosphatase: 123 IU/L — ABNORMAL HIGH (ref 44–121)
BUN/Creatinine Ratio: 19 (ref 12–28)
BUN: 20 mg/dL (ref 8–27)
Bilirubin Total: 0.3 mg/dL (ref 0.0–1.2)
CO2: 24 mmol/L (ref 20–29)
Calcium: 9.3 mg/dL (ref 8.7–10.3)
Chloride: 106 mmol/L (ref 96–106)
Creatinine, Ser: 1.06 mg/dL — ABNORMAL HIGH (ref 0.57–1.00)
Globulin, Total: 2.6 g/dL (ref 1.5–4.5)
Glucose: 87 mg/dL (ref 70–99)
Potassium: 4.4 mmol/L (ref 3.5–5.2)
Sodium: 143 mmol/L (ref 134–144)
Total Protein: 6.8 g/dL (ref 6.0–8.5)
eGFR: 54 mL/min/{1.73_m2} — ABNORMAL LOW (ref >59)

## 2023-12-13 LAB — LIPID PANEL
Chol/HDL Ratio: 3.3 ratio (ref 0.0–4.4)
Cholesterol, Total: 217 mg/dL — ABNORMAL HIGH (ref 100–199)
HDL: 66 mg/dL (ref >39)
LDL Chol Calc (NIH): 122 mg/dL — ABNORMAL HIGH (ref 0–99)
Triglycerides: 164 mg/dL — ABNORMAL HIGH (ref 0–149)
VLDL Cholesterol Cal: 29 mg/dL (ref 5–40)

## 2023-12-15 ENCOUNTER — Ambulatory Visit (INDEPENDENT_AMBULATORY_CARE_PROVIDER_SITE_OTHER): Admitting: Internal Medicine

## 2023-12-15 ENCOUNTER — Ambulatory Visit: Payer: Self-pay | Admitting: Internal Medicine

## 2023-12-15 VITALS — BP 142/70 | HR 62 | Temp 97.1°F | Ht 63.0 in | Wt 121.0 lb

## 2023-12-15 DIAGNOSIS — M810 Age-related osteoporosis without current pathological fracture: Secondary | ICD-10-CM

## 2023-12-15 DIAGNOSIS — I10 Essential (primary) hypertension: Secondary | ICD-10-CM

## 2023-12-15 DIAGNOSIS — N1831 Chronic kidney disease, stage 3a: Secondary | ICD-10-CM

## 2023-12-15 DIAGNOSIS — F02B0 Dementia in other diseases classified elsewhere, moderate, without behavioral disturbance, psychotic disturbance, mood disturbance, and anxiety: Secondary | ICD-10-CM

## 2023-12-15 DIAGNOSIS — E782 Mixed hyperlipidemia: Secondary | ICD-10-CM | POA: Diagnosis not present

## 2023-12-15 DIAGNOSIS — G308 Other Alzheimer's disease: Secondary | ICD-10-CM

## 2023-12-15 MED ORDER — MEMANTINE HCL 10 MG PO TABS: 10.0000 mg | ORAL_TABLET | Freq: Every morning | ORAL | 0 refills | Status: DC

## 2023-12-15 MED ORDER — IBANDRONATE SODIUM 150 MG PO TABS
150.0000 mg | ORAL_TABLET | ORAL | 1 refills | Status: AC
Start: 2023-12-15 — End: 2024-06-12

## 2023-12-15 MED ORDER — DONEPEZIL HCL 10 MG PO TABS: 10.0000 mg | ORAL_TABLET | Freq: Every morning | ORAL | 0 refills | Status: DC

## 2023-12-15 MED ORDER — LOSARTAN POTASSIUM 50 MG PO TABS: 50.0000 mg | ORAL_TABLET | Freq: Every day | ORAL | 0 refills | Status: DC

## 2023-12-15 NOTE — Progress Notes (Signed)
 Established Patient Office Visit  Subjective:  Patient ID: Cindy Patel, female    DOB: Oct 13, 1945  Age: 78 y.o. MRN: [id number]  Chief Complaint  Patient presents with   Follow-up    6 Months Follow Up    No new complaints, here for lab review and medication refills. Labs reviewed and notable for stable but elevated LDL and deterioration in trigs. Had a blood panel in [place]  with elevated apoE and CRP.     No other concerns at this time.   Past Medical History:  Diagnosis Date   Alzheimer'Kaynan Klonowski dementia (HCC)    Hyperlipidemia    Hypertension    Lumbar stress fracture    Mitral regurgitation    Urolithiasis     Past Surgical History:  Procedure Laterality Date   ABDOMINAL HYSTERECTOMY     CHOLECYSTECTOMY      Social History   Socioeconomic History   Marital status: Married    Spouse name: Not on file   Number of children: Not on file   Years of education: Not on file   Highest education level: Not on file  Occupational History   Occupation: Retired  Tobacco Use   Smoking status: Never   Smokeless tobacco: Not on file  Vaping Use   Vaping status: Never Used  Substance and Sexual Activity   Alcohol use: Yes   Drug use: Never   Sexual activity: Yes  Other Topics Concern   Not on file  Social History Narrative   Not on file   Social Drivers of Health   Financial Resource Strain: Not on file  Food Insecurity: Not on file  Transportation Needs: Not on file  Physical Activity: Not on file  Stress: Not on file  Social Connections: Not on file  Intimate Partner Violence: Not on file    Family History  Problem Relation Age of Onset   Breast cancer Neg Hx     Allergies  Allergen Reactions   Iodine Rash    Outpatient Medications Prior to Visit  Medication Sig   acetaminophen (TYLENOL) 500 MG tablet Take 1,000 mg by mouth every 6 (six) hours as needed.   calcium carbonate 100 mg/ml SUSP Take by mouth.   ibuprofen (ADVIL,MOTRIN) 600 MG  tablet Take 600 mg by mouth every 6 (six) hours as needed. (Patient not taking: Reported on 01/07/2023)   Magnesium Carbonate (MAGNESIUM GLUCONATE) 54mg /38ml syringe Take by mouth.   Multiple Vitamin (MULTIVITAMIN PO) Take by mouth.   Omega-3 Fatty Acids (FISH OIL) 1200 MG CAPS Take 1 capsule by mouth daily.   [DISCONTINUED] donepezil  (ARICEPT ) 10 MG tablet TAKE 1 TABLET BY MOUTH ONCE DAILY IN THE MORNING   [DISCONTINUED] ibandronate  (BONIVA ) 150 MG tablet Take 1 tablet (150 mg total) by mouth every 30 (thirty) days. Take in the morning with a full glass of water, on an empty stomach, and do not take anything else by mouth or lie down for the next 30 min.   [DISCONTINUED] losartan  (COZAAR ) 50 MG tablet Take 1 tablet by mouth once daily   [DISCONTINUED] memantine  (NAMENDA ) 10 MG tablet TAKE 1 TABLET BY MOUTH ONCE DAILY IN THE MORNING   No facility-administered medications prior to visit.    Review of Systems  Constitutional: Negative.   HENT: Negative.    Eyes: Negative.   Respiratory: Negative.    Cardiovascular: Negative.   Gastrointestinal: Negative.   Genitourinary: Negative.   Skin: Negative.   Neurological: Negative.   Endo/Heme/Allergies: Negative.   Psychiatric/Behavioral:  Positive for memory loss.        Objective:   BP (!) 142/70   Pulse 62   Temp (!) 97.1 F (36.2 C) (Tympanic)   Ht 5\' 3"  (1.6 m)   Wt 121 lb (54.9 kg)   SpO2 98%   BMI 21.43 kg/m   Vitals:   12/15/23 0958  BP: (!) 142/70  Pulse: 62  Temp: (!) 97.1 F (36.2 C)  Height: 5\' 3"  (1.6 m)  Weight: 121 lb (54.9 kg)  SpO2: 98%  TempSrc: Tympanic  BMI (Calculated): 21.44    Physical Exam Vitals reviewed.  Constitutional:      General: She is not in acute distress. HENT:     Head: Normocephalic.     Nose: Nose normal.     Mouth/Throat:     Mouth: Mucous membranes are moist.  Eyes:     Extraocular Movements: Extraocular movements intact.     Pupils: Pupils are equal, round, and reactive to  light.  Cardiovascular:     Rate and Rhythm: Normal rate and regular rhythm.     Heart sounds: No murmur heard. Pulmonary:     Effort: Pulmonary effort is normal.     Breath sounds: No rhonchi or rales.  Abdominal:     General: Abdomen is flat.     Palpations: There is no hepatomegaly, splenomegaly or mass.  Musculoskeletal:        General: Normal range of motion.     Cervical back: Normal range of motion. No tenderness.  Skin:    General: Skin is warm and dry.  Neurological:     General: No focal deficit present.     Mental Status: She is alert and oriented to person, place, and time.     Cranial Nerves: No cranial nerve deficit.     Motor: No weakness.     Deep Tendon Reflexes:     Reflex Scores:      Tricep reflexes are 4+ on the right side and 4+ on the left side.      Bicep reflexes are 4+ on the right side and 4+ on the left side.      Brachioradialis reflexes are 4+ on the right side and 4+ on the left side.      Patellar reflexes are 4+ on the right side and 4+ on the left side. Psychiatric:        Mood and Affect: Mood normal.        Behavior: Behavior normal.      No results found for any visits on 12/15/23.  Recent Results (from the past 2160 hours)  Lipid panel     Status: Abnormal   Collection Time: 12/12/23  9:37 AM  Result Value Ref Range   Cholesterol, Total 217 (H) 100 - 199 mg/dL   Triglycerides 191 (H) 0 - 149 mg/dL   HDL 66 >47 mg/dL   VLDL Cholesterol Cal 29 5 - 40 mg/dL   LDL Chol Calc (NIH) 829 (H) 0 - 99 mg/dL   Chol/HDL Ratio 3.3 0.0 - 4.4 ratio    Comment:                                   T. Chol/HDL Ratio  Men  Women                               1/2 Avg.Risk  3.4    3.3                                   Avg.Risk  5.0    4.4                                2X Avg.Risk  9.6    7.1                                3X Avg.Risk 23.4   11.0   Comprehensive metabolic panel     Status: Abnormal    Collection Time: 12/12/23  9:37 AM  Result Value Ref Range   Glucose 87 70 - 99 mg/dL   BUN 20 8 - 27 mg/dL   Creatinine, Ser 4.03 (H) 0.57 - 1.00 mg/dL   eGFR 54 (L) >47 QQ/VZD/6.38   BUN/Creatinine Ratio 19 12 - 28   Sodium 143 134 - 144 mmol/L   Potassium 4.4 3.5 - 5.2 mmol/L   Chloride 106 96 - 106 mmol/L   CO2 24 20 - 29 mmol/L   Calcium 9.3 8.7 - 10.3 mg/dL   Total Protein 6.8 6.0 - 8.5 g/dL   Albumin 4.2 3.8 - 4.8 g/dL   Globulin, Total 2.6 1.5 - 4.5 g/dL   Bilirubin Total 0.3 0.0 - 1.2 mg/dL   Alkaline Phosphatase 123 (H) 44 - 121 IU/L   AST 20 0 - 40 IU/L   ALT 13 0 - 32 IU/L      Assessment & Plan:  As per problem list  Problem List Items Addressed This Visit       Nervous and Auditory   DAT (dementia of Alzheimer type) (HCC)   Relevant Medications   donepezil  (ARICEPT ) 10 MG tablet   memantine  (NAMENDA ) 10 MG tablet   Other Relevant Orders   MR Brain Wo Contrast     Musculoskeletal and Integument   Age-related osteoporosis without current pathological fracture   Relevant Medications   ibandronate  (BONIVA ) 150 MG tablet     Other   Moderate mixed hyperlipidemia not requiring statin therapy   Relevant Medications   losartan  (COZAAR ) 50 MG tablet   Other Relevant Orders   Comprehensive metabolic panel with GFR   Lipid panel   Other Visit Diagnoses       Primary hypertension    -  Primary   Relevant Medications   losartan  (COZAAR ) 50 MG tablet   Other Relevant Orders   CT MODIFIED CAL SCORE     CKD stage 3a, GFR 45-59 ml/min (HCC)           Return in about 4 months (around 04/16/2024) for fu with labs prior.   Total time spent: 20 minutes  Arzella Bitters, MD  12/15/2023   This document may have been prepared by [facility] Voice Recognition software and as such may include unintentional dictation errors.

## 2023-12-16 ENCOUNTER — Encounter: Payer: Self-pay | Admitting: Internal Medicine

## 2023-12-19 ENCOUNTER — Ambulatory Visit
Admission: RE | Admit: 2023-12-19 | Discharge: 2023-12-19 | Disposition: A | Discharge status: Home / Self Care | Payer: Self-pay | Source: Ambulatory Visit | Attending: Internal Medicine | Admitting: Internal Medicine

## 2023-12-19 DIAGNOSIS — I639 Cerebral infarction, unspecified: Secondary | ICD-10-CM | POA: Diagnosis not present

## 2023-12-19 DIAGNOSIS — G308 Other Alzheimer's disease: Secondary | ICD-10-CM | POA: Insufficient documentation

## 2023-12-19 DIAGNOSIS — F02B0 Dementia in other diseases classified elsewhere, moderate, without behavioral disturbance, psychotic disturbance, mood disturbance, and anxiety: Secondary | ICD-10-CM | POA: Insufficient documentation

## 2023-12-19 DIAGNOSIS — F0150 Vascular dementia without behavioral disturbance: Secondary | ICD-10-CM | POA: Diagnosis not present

## 2023-12-19 DIAGNOSIS — I10 Essential (primary) hypertension: Secondary | ICD-10-CM | POA: Insufficient documentation

## 2024-01-04 ENCOUNTER — Other Ambulatory Visit: Payer: Self-pay | Admitting: Internal Medicine

## 2024-01-04 DIAGNOSIS — F02B0 Dementia in other diseases classified elsewhere, moderate, without behavioral disturbance, psychotic disturbance, mood disturbance, and anxiety: Secondary | ICD-10-CM

## 2024-01-06 ENCOUNTER — Encounter: Payer: Self-pay | Admitting: Internal Medicine

## 2024-01-07 ENCOUNTER — Ambulatory Visit: Payer: Self-pay | Admitting: Internal Medicine

## 2024-01-09 DIAGNOSIS — E785 Hyperlipidemia, unspecified: Secondary | ICD-10-CM | POA: Diagnosis not present

## 2024-01-09 DIAGNOSIS — N951 Menopausal and female climacteric states: Secondary | ICD-10-CM | POA: Diagnosis not present

## 2024-01-09 DIAGNOSIS — R7879 Finding of abnormal level of heavy metals in blood: Secondary | ICD-10-CM | POA: Diagnosis not present

## 2024-01-09 DIAGNOSIS — N189 Chronic kidney disease, unspecified: Secondary | ICD-10-CM | POA: Diagnosis not present

## 2024-01-09 DIAGNOSIS — I499 Cardiac arrhythmia, unspecified: Secondary | ICD-10-CM | POA: Diagnosis not present

## 2024-01-09 DIAGNOSIS — G3184 Mild cognitive impairment, so stated: Secondary | ICD-10-CM | POA: Diagnosis not present

## 2024-01-09 DIAGNOSIS — Z77120 Contact with and (suspected) exposure to mold (toxic): Secondary | ICD-10-CM | POA: Diagnosis not present

## 2024-01-09 DIAGNOSIS — R4189 Other symptoms and signs involving cognitive functions and awareness: Secondary | ICD-10-CM | POA: Diagnosis not present

## 2024-01-13 DIAGNOSIS — Z77120 Contact with and (suspected) exposure to mold (toxic): Secondary | ICD-10-CM | POA: Diagnosis not present

## 2024-01-17 DIAGNOSIS — M5430 Sciatica, unspecified side: Secondary | ICD-10-CM | POA: Diagnosis not present

## 2024-01-17 DIAGNOSIS — G43909 Migraine, unspecified, not intractable, without status migrainosus: Secondary | ICD-10-CM | POA: Diagnosis not present

## 2024-01-17 DIAGNOSIS — N1831 Chronic kidney disease, stage 3a: Secondary | ICD-10-CM | POA: Diagnosis not present

## 2024-01-17 DIAGNOSIS — Z87892 Personal history of anaphylaxis: Secondary | ICD-10-CM | POA: Diagnosis not present

## 2024-01-17 DIAGNOSIS — Z818 Family history of other mental and behavioral disorders: Secondary | ICD-10-CM | POA: Diagnosis not present

## 2024-01-17 DIAGNOSIS — Z87891 Personal history of nicotine dependence: Secondary | ICD-10-CM | POA: Diagnosis not present

## 2024-01-17 DIAGNOSIS — G309 Alzheimer's disease, unspecified: Secondary | ICD-10-CM | POA: Diagnosis not present

## 2024-01-17 DIAGNOSIS — Z5982 Transportation insecurity: Secondary | ICD-10-CM | POA: Diagnosis not present

## 2024-01-17 DIAGNOSIS — M1990 Unspecified osteoarthritis, unspecified site: Secondary | ICD-10-CM | POA: Diagnosis not present

## 2024-01-17 DIAGNOSIS — E785 Hyperlipidemia, unspecified: Secondary | ICD-10-CM | POA: Diagnosis not present

## 2024-01-17 DIAGNOSIS — I129 Hypertensive chronic kidney disease with stage 1 through stage 4 chronic kidney disease, or unspecified chronic kidney disease: Secondary | ICD-10-CM | POA: Diagnosis not present

## 2024-01-17 DIAGNOSIS — R32 Unspecified urinary incontinence: Secondary | ICD-10-CM | POA: Diagnosis not present

## 2024-01-22 DIAGNOSIS — R7879 Finding of abnormal level of heavy metals in blood: Secondary | ICD-10-CM | POA: Diagnosis not present

## 2024-02-18 DIAGNOSIS — J301 Allergic rhinitis due to pollen: Secondary | ICD-10-CM | POA: Diagnosis not present

## 2024-02-18 DIAGNOSIS — G3184 Mild cognitive impairment, so stated: Secondary | ICD-10-CM | POA: Diagnosis not present

## 2024-02-18 DIAGNOSIS — R4189 Other symptoms and signs involving cognitive functions and awareness: Secondary | ICD-10-CM | POA: Diagnosis not present

## 2024-02-18 DIAGNOSIS — R7879 Finding of abnormal level of heavy metals in blood: Secondary | ICD-10-CM | POA: Diagnosis not present

## 2024-02-18 DIAGNOSIS — Z77120 Contact with and (suspected) exposure to mold (toxic): Secondary | ICD-10-CM | POA: Diagnosis not present

## 2024-02-18 DIAGNOSIS — J3089 Other allergic rhinitis: Secondary | ICD-10-CM | POA: Diagnosis not present

## 2024-02-24 DIAGNOSIS — H5203 Hypermetropia, bilateral: Secondary | ICD-10-CM | POA: Diagnosis not present

## 2024-02-24 DIAGNOSIS — H52223 Regular astigmatism, bilateral: Secondary | ICD-10-CM | POA: Diagnosis not present

## 2024-02-24 DIAGNOSIS — H524 Presbyopia: Secondary | ICD-10-CM | POA: Diagnosis not present

## 2024-02-27 ENCOUNTER — Other Ambulatory Visit: Payer: Self-pay | Admitting: Medical Genetics

## 2024-04-15 ENCOUNTER — Ambulatory Visit (INDEPENDENT_AMBULATORY_CARE_PROVIDER_SITE_OTHER): Admitting: Cardiology

## 2024-04-15 ENCOUNTER — Other Ambulatory Visit

## 2024-04-15 ENCOUNTER — Encounter: Payer: Self-pay | Admitting: Cardiology

## 2024-04-15 VITALS — BP 104/60 | HR 64 | Ht 63.0 in | Wt 120.0 lb

## 2024-04-15 DIAGNOSIS — L237 Allergic contact dermatitis due to plants, except food: Secondary | ICD-10-CM | POA: Diagnosis not present

## 2024-04-15 DIAGNOSIS — E782 Mixed hyperlipidemia: Secondary | ICD-10-CM | POA: Diagnosis not present

## 2024-04-15 DIAGNOSIS — Z0130 Encounter for examination of blood pressure without abnormal findings: Secondary | ICD-10-CM

## 2024-04-15 MED ORDER — METHYLPREDNISOLONE ACETATE 40 MG/ML IJ SUSP
40.0000 mg | Freq: Once | INTRAMUSCULAR | Status: AC
  Administered 2024-04-15: 40 mg via INTRAMUSCULAR

## 2024-04-15 NOTE — Progress Notes (Signed)
 Established Patient Office Visit  Subjective:  Patient ID: Cindy Patel, female    DOB: [birth year]  Age: 78 y.o. MRN: [id number]  Chief Complaint  Patient presents with   Acute Visit    Poison ivy/oak on bilateral arms     Patient in office for an acute visit, poison ivy on both arms for the past 3 weeks. Patient has been using calamine lotion with no relief. Continues to have redness and itching of arms. Will give depo medrol  injection today.     No other concerns at this time.   Past Medical History:  Diagnosis Date   Alzheimer's dementia (HCC)    Hyperlipidemia    Hypertension    Lumbar stress fracture    Mitral regurgitation    Urolithiasis     Past Surgical History:  Procedure Laterality Date   ABDOMINAL HYSTERECTOMY     CHOLECYSTECTOMY      Social History   Socioeconomic History   Marital status: Married    Spouse name: Not on file   Number of children: Not on file   Years of education: Not on file   Highest education level: Not on file  Occupational History   Occupation: Retired  Tobacco Use   Smoking status: Never   Smokeless tobacco: Not on file  Vaping Use   Vaping status: Never Used  Substance and Sexual Activity   Alcohol use: Yes   Drug use: Never   Sexual activity: Yes  Other Topics Concern   Not on file  Social History Narrative   Not on file   Social Drivers of Health   Financial Resource Strain: Not on file  Food Insecurity: Not on file  Transportation Needs: Not on file  Physical Activity: Not on file  Stress: Not on file  Social Connections: Not on file  Intimate Partner Violence: Not on file    Family History  Problem Relation Age of Onset   Breast cancer Neg Hx     Allergies  Allergen Reactions   Iodine Rash    Outpatient Medications Prior to Visit  Medication Sig   acetaminophen (TYLENOL) 500 MG tablet Take 1,000 mg by mouth every 6 (six) hours as needed.   calcium carbonate 100 mg/ml SUSP Take by mouth.    donepezil  (ARICEPT ) 10 MG tablet TAKE 1 TABLET BY MOUTH ONCE DAILY IN THE MORNING   ibandronate  (BONIVA ) 150 MG tablet Take 1 tablet (150 mg total) by mouth every 30 (thirty) days. Take in the morning with a full glass of water, on an empty stomach, and do not take anything else by mouth or lie down for the next 30 min.   losartan  (COZAAR ) 50 MG tablet Take 1 tablet (50 mg total) by mouth daily.   Magnesium Carbonate (MAGNESIUM GLUCONATE) 54mg /39ml syringe Take by mouth.   memantine  (NAMENDA ) 10 MG tablet Take 1 tablet (10 mg total) by mouth every morning.   Multiple Vitamin (MULTIVITAMIN PO) Take by mouth.   Omega-3 Fatty Acids (FISH OIL) 1200 MG CAPS Take 1 capsule by mouth daily.   [DISCONTINUED] ibuprofen (ADVIL,MOTRIN) 600 MG tablet Take 600 mg by mouth every 6 (six) hours as needed. (Patient not taking: Reported on 01/07/2023)   No facility-administered medications prior to visit.    ROS     Objective:   BP 104/60   Pulse 64   Ht 5' 3 (1.6 m)   Wt 120 lb (54.4 kg)   SpO2 94%   BMI 21.26 kg/m  Vitals:   04/15/24 0950  BP: 104/60  Pulse: 64  Height: 5' 3 (1.6 m)  Weight: 120 lb (54.4 kg)  SpO2: 94%  BMI (Calculated): 21.26    Physical Exam   No results found for any visits on 04/15/24.  No results found for this or any previous visit (from the past 2160 hours).    Assessment & Plan:  Depo medrol  injection today  Problem List Items Addressed This Visit       Musculoskeletal and Integument   Poison ivy dermatitis - Primary    Return if symptoms worsen or fail to improve.   Total time spent: 25 minutes  [employer], NP  04/15/2024   This document may have been prepared by [employer] Voice Recognition software and as such may include unintentional dictation errors.

## 2024-04-16 LAB — COMPREHENSIVE METABOLIC PANEL WITH GFR
ALT: 19 IU/L (ref 0–32)
AST: 25 IU/L (ref 0–40)
Albumin: 4.3 g/dL (ref 3.8–4.8)
Alkaline Phosphatase: 132 IU/L (ref 49–135)
BUN/Creatinine Ratio: 17 (ref 12–28)
BUN: 21 mg/dL (ref 8–27)
Bilirubin Total: 0.5 mg/dL (ref 0.0–1.2)
CO2: 23 mmol/L (ref 20–29)
Calcium: 9.2 mg/dL (ref 8.7–10.3)
Chloride: 107 mmol/L — ABNORMAL HIGH (ref 96–106)
Creatinine, Ser: 1.23 mg/dL — ABNORMAL HIGH (ref 0.57–1.00)
Globulin, Total: 2.5 g/dL (ref 1.5–4.5)
Glucose: 82 mg/dL (ref 70–99)
Potassium: 4.3 mmol/L (ref 3.5–5.2)
Sodium: 142 mmol/L (ref 134–144)
Total Protein: 6.8 g/dL (ref 6.0–8.5)
eGFR: 45 mL/min/1.73 — ABNORMAL LOW (ref >59)

## 2024-04-16 LAB — LIPID PANEL
Chol/HDL Ratio: 2.7 ratio (ref 0.0–4.4)
Cholesterol, Total: 216 mg/dL — ABNORMAL HIGH (ref 100–199)
HDL: 80 mg/dL (ref >39)
LDL Chol Calc (NIH): 121 mg/dL — ABNORMAL HIGH (ref 0–99)
Triglycerides: 84 mg/dL (ref 0–149)
VLDL Cholesterol Cal: 15 mg/dL (ref 5–40)

## 2024-04-21 ENCOUNTER — Ambulatory Visit (INDEPENDENT_AMBULATORY_CARE_PROVIDER_SITE_OTHER): Admitting: Internal Medicine

## 2024-04-21 ENCOUNTER — Encounter: Payer: Self-pay | Admitting: Internal Medicine

## 2024-04-21 VITALS — BP 118/58 | HR 65 | Temp 97.0°F | Ht 63.0 in | Wt 120.0 lb

## 2024-04-21 DIAGNOSIS — E782 Mixed hyperlipidemia: Secondary | ICD-10-CM

## 2024-04-21 DIAGNOSIS — I10 Essential (primary) hypertension: Secondary | ICD-10-CM | POA: Diagnosis not present

## 2024-04-21 DIAGNOSIS — G308 Other Alzheimer's disease: Secondary | ICD-10-CM

## 2024-04-21 DIAGNOSIS — F02B0 Dementia in other diseases classified elsewhere, moderate, without behavioral disturbance, psychotic disturbance, mood disturbance, and anxiety: Secondary | ICD-10-CM

## 2024-04-21 MED ORDER — MEMANTINE HCL 10 MG PO TABS: 10.0000 mg | ORAL_TABLET | Freq: Every morning | ORAL | 0 refills | Status: DC

## 2024-04-21 MED ORDER — LOSARTAN POTASSIUM 50 MG PO TABS: 50.0000 mg | ORAL_TABLET | Freq: Every day | ORAL | 0 refills | Status: DC

## 2024-04-21 MED ORDER — DONEPEZIL HCL 10 MG PO TABS: 10.0000 mg | ORAL_TABLET | Freq: Every morning | ORAL | 0 refills | Status: DC

## 2024-04-21 NOTE — Progress Notes (Signed)
 Established Patient Office Visit  Subjective:  Patient ID: Cindy Patel, female    DOB: April 10, 1946  Age: 78 y.o. MRN: [id number]  Chief Complaint  Patient presents with   Follow-up    4 month follow up    No new complaints, here for lab review and medication refills. Declining memory with stable functionality but no wandering.     No other concerns at this time.   Past Medical History:  Diagnosis Date   Alzheimer'Kathlee Barnhardt dementia (HCC)    Hyperlipidemia    Hypertension    Lumbar stress fracture    Mitral regurgitation    Urolithiasis     Past Surgical History:  Procedure Laterality Date   ABDOMINAL HYSTERECTOMY     CHOLECYSTECTOMY      Social History   Socioeconomic History   Marital status: Married    Spouse name: Not on file   Number of children: Not on file   Years of education: Not on file   Highest education level: Not on file  Occupational History   Occupation: Retired  Tobacco Use   Smoking status: Never   Smokeless tobacco: Not on file  Vaping Use   Vaping status: Never Used  Substance and Sexual Activity   Alcohol use: Yes   Drug use: Never   Sexual activity: Yes  Other Topics Concern   Not on file  Social History Narrative   Not on file   Social Drivers of Health   Financial Resource Strain: Not on file  Food Insecurity: Not on file  Transportation Needs: Not on file  Physical Activity: Not on file  Stress: Not on file  Social Connections: Not on file  Intimate Partner Violence: Not on file    Family History  Problem Relation Age of Onset   Breast cancer Neg Hx     Allergies  Allergen Reactions   Iodine Rash    Outpatient Medications Prior to Visit  Medication Sig   acetaminophen (TYLENOL) 500 MG tablet Take 1,000 mg by mouth every 6 (six) hours as needed.   B Complex Vitamins (VITAMIN B COMPLEX PO) Take by mouth.   calcium carbonate 100 mg/ml SUSP Take by mouth.   ibandronate  (BONIVA ) 150 MG tablet Take 1 tablet (150 mg  total) by mouth every 30 (thirty) days. Take in the morning with a full glass of water, on an empty stomach, and do not take anything else by mouth or lie down for the next 30 min.   Magnesium Carbonate (MAGNESIUM GLUCONATE) 54mg /79ml syringe Take by mouth.   Omega-3 Fatty Acids (FISH OIL) 1200 MG CAPS Take 1 capsule by mouth daily.   [DISCONTINUED] donepezil  (ARICEPT ) 10 MG tablet TAKE 1 TABLET BY MOUTH ONCE DAILY IN THE MORNING   [DISCONTINUED] losartan  (COZAAR ) 50 MG tablet Take 1 tablet (50 mg total) by mouth daily.   [DISCONTINUED] memantine  (NAMENDA ) 10 MG tablet Take 1 tablet (10 mg total) by mouth every morning.   Multiple Vitamin (MULTIVITAMIN PO) Take by mouth. (Patient not taking: Reported on 04/21/2024)   No facility-administered medications prior to visit.    Review of Systems  Constitutional: Negative.  Negative for weight loss.  HENT: Negative.    Eyes: Negative.   Respiratory: Negative.    Cardiovascular: Negative.   Gastrointestinal: Negative.   Genitourinary: Negative.   Skin: Negative.   Neurological: Negative.   Endo/Heme/Allergies: Negative.   Psychiatric/Behavioral:  Positive for memory loss.        Objective:   BP ROLLEN)  118/58   Pulse 65   Temp (!) 97 F (36.1 C) (Tympanic)   Ht 5' 3 (1.6 m)   Wt 120 lb (54.4 kg)   SpO2 97%   BMI 21.26 kg/m   Vitals:   04/21/24 1008  BP: (!) 118/58  Pulse: 65  Temp: (!) 97 F (36.1 C)  Height: 5' 3 (1.6 m)  Weight: 120 lb (54.4 kg)  SpO2: 97%  TempSrc: Tympanic  BMI (Calculated): 21.26    Physical Exam Vitals reviewed.  Constitutional:      General: She is not in acute distress. HENT:     Head: Normocephalic.     Nose: Nose normal.     Mouth/Throat:     Mouth: Mucous membranes are moist.  Eyes:     Extraocular Movements: Extraocular movements intact.     Pupils: Pupils are equal, round, and reactive to light.  Cardiovascular:     Rate and Rhythm: Normal rate and regular rhythm.     Heart sounds:  No murmur heard. Pulmonary:     Effort: Pulmonary effort is normal.     Breath sounds: No rhonchi or rales.  Abdominal:     General: Abdomen is flat.     Palpations: There is no hepatomegaly, splenomegaly or mass.  Musculoskeletal:        General: Normal range of motion.     Cervical back: Normal range of motion. No tenderness.  Skin:    General: Skin is warm and dry.  Neurological:     General: No focal deficit present.     Mental Status: She is alert and oriented to person, place, and time.     Cranial Nerves: No cranial nerve deficit.     Motor: No weakness.     Deep Tendon Reflexes:     Reflex Scores:      Tricep reflexes are 4+ on the right side and 4+ on the left side.      Bicep reflexes are 4+ on the right side and 4+ on the left side.      Brachioradialis reflexes are 4+ on the right side and 4+ on the left side.      Patellar reflexes are 4+ on the right side and 4+ on the left side. Psychiatric:        Mood and Affect: Mood normal.        Behavior: Behavior normal.      No results found for any visits on 04/21/24.  Recent Results (from the past 2160 hours)  Comprehensive metabolic panel with GFR     Status: Abnormal   Collection Time: 04/15/24  9:39 AM  Result Value Ref Range   Glucose 82 70 - 99 mg/dL   BUN 21 8 - 27 mg/dL   Creatinine, Ser 8.76 (H) 0.57 - 1.00 mg/dL   eGFR 45 (L) >40 fO/fpw/8.26   BUN/Creatinine Ratio 17 12 - 28   Sodium 142 134 - 144 mmol/L   Potassium 4.3 3.5 - 5.2 mmol/L   Chloride 107 (H) 96 - 106 mmol/L   CO2 23 20 - 29 mmol/L   Calcium 9.2 8.7 - 10.3 mg/dL   Total Protein 6.8 6.0 - 8.5 g/dL   Albumin 4.3 3.8 - 4.8 g/dL   Globulin, Total 2.5 1.5 - 4.5 g/dL   Bilirubin Total 0.5 0.0 - 1.2 mg/dL   Alkaline Phosphatase 132 49 - 135 IU/L    Comment:               **  Please note reference interval change**   AST 25 0 - 40 IU/L   ALT 19 0 - 32 IU/L  Lipid panel     Status: Abnormal   Collection Time: 04/15/24  9:39 AM  Result Value  Ref Range   Cholesterol, Total 216 (H) 100 - 199 mg/dL   Triglycerides 84 0 - 149 mg/dL   HDL 80 >60 mg/dL   VLDL Cholesterol Cal 15 5 - 40 mg/dL   LDL Chol Calc (NIH) 878 (H) 0 - 99 mg/dL   Chol/HDL Ratio 2.7 0.0 - 4.4 ratio    Comment:                                   T. Chol/HDL Ratio                                             Men  Women                               1/2 Avg.Risk  3.4    3.3                                   Avg.Risk  5.0    4.4                                2X Avg.Risk  9.6    7.1                                3X Avg.Risk 23.4   11.0       Assessment & Plan:  Caycee was seen today for follow-up.  Moderate mixed hyperlipidemia not requiring statin therapy -     Lipid panel  Moderate Alzheimer'Shelbylynn Walczyk dementia of other onset without behavioral disturbance, psychotic disturbance, mood disturbance, or anxiety (HCC) -     Donepezil  HCl; Take 1 tablet (10 mg total) by mouth every morning.  Dispense: 90 tablet; Refill: 0 -     Memantine  HCl; Take 1 tablet (10 mg total) by mouth every morning.  Dispense: 90 tablet; Refill: 0  Primary hypertension -     Losartan  Potassium; Take 1 tablet (50 mg total) by mouth daily.  Dispense: 90 tablet; Refill: 0 -     Comprehensive metabolic panel with GFR -     CBC With Diff/Platelet    Problem List Items Addressed This Visit       Nervous and Auditory   DAT (dementia of Alzheimer type) (HCC)   Relevant Medications   donepezil  (ARICEPT ) 10 MG tablet   memantine  (NAMENDA ) 10 MG tablet     Other   Moderate mixed hyperlipidemia not requiring statin therapy - Primary   Relevant Medications   losartan  (COZAAR ) 50 MG tablet   Other Relevant Orders   Lipid panel   Other Visit Diagnoses       Primary hypertension       Relevant Medications   losartan  (COZAAR ) 50 MG tablet   Other Relevant Orders   Comprehensive metabolic panel with GFR  CBC With Diff/Platelet       Return in about 3 months (around 07/21/2024) for  awv with labs prior.   Total time spent: 20 minutes  Sherrill Cinderella Perry, MD  04/21/2024   This document may have been prepared by [facility] Voice Recognition software and as such may include unintentional dictation errors.

## 2024-04-26 DIAGNOSIS — Z77120 Contact with and (suspected) exposure to mold (toxic): Secondary | ICD-10-CM | POA: Diagnosis not present

## 2024-04-26 DIAGNOSIS — R4189 Other symptoms and signs involving cognitive functions and awareness: Secondary | ICD-10-CM | POA: Diagnosis not present

## 2024-04-26 DIAGNOSIS — M419 Scoliosis, unspecified: Secondary | ICD-10-CM | POA: Diagnosis not present

## 2024-04-26 DIAGNOSIS — R7879 Finding of abnormal level of heavy metals in blood: Secondary | ICD-10-CM | POA: Diagnosis not present

## 2024-04-26 DIAGNOSIS — F0390 Unspecified dementia without behavioral disturbance: Secondary | ICD-10-CM | POA: Diagnosis not present

## 2024-04-26 DIAGNOSIS — K5900 Constipation, unspecified: Secondary | ICD-10-CM | POA: Diagnosis not present

## 2024-04-26 DIAGNOSIS — R195 Other fecal abnormalities: Secondary | ICD-10-CM | POA: Diagnosis not present

## 2024-05-04 DIAGNOSIS — N1831 Chronic kidney disease, stage 3a: Secondary | ICD-10-CM | POA: Diagnosis not present

## 2024-05-04 DIAGNOSIS — I129 Hypertensive chronic kidney disease with stage 1 through stage 4 chronic kidney disease, or unspecified chronic kidney disease: Secondary | ICD-10-CM | POA: Diagnosis not present

## 2024-05-10 ENCOUNTER — Encounter: Payer: Self-pay | Admitting: Internal Medicine

## 2024-05-10 DIAGNOSIS — M9901 Segmental and somatic dysfunction of cervical region: Secondary | ICD-10-CM | POA: Diagnosis not present

## 2024-05-10 DIAGNOSIS — M5412 Radiculopathy, cervical region: Secondary | ICD-10-CM | POA: Diagnosis not present

## 2024-05-10 DIAGNOSIS — M9903 Segmental and somatic dysfunction of lumbar region: Secondary | ICD-10-CM | POA: Diagnosis not present

## 2024-05-10 DIAGNOSIS — M9905 Segmental and somatic dysfunction of pelvic region: Secondary | ICD-10-CM | POA: Diagnosis not present

## 2024-05-11 ENCOUNTER — Other Ambulatory Visit
Admission: RE | Admit: 2024-05-11 | Discharge: 2024-05-11 | Disposition: A | Discharge status: Home / Self Care | Payer: Self-pay | Source: Ambulatory Visit | Attending: Medical Genetics | Admitting: Medical Genetics

## 2024-05-12 DIAGNOSIS — M9903 Segmental and somatic dysfunction of lumbar region: Secondary | ICD-10-CM | POA: Diagnosis not present

## 2024-05-12 DIAGNOSIS — M9901 Segmental and somatic dysfunction of cervical region: Secondary | ICD-10-CM | POA: Diagnosis not present

## 2024-05-12 DIAGNOSIS — M5412 Radiculopathy, cervical region: Secondary | ICD-10-CM | POA: Diagnosis not present

## 2024-05-12 DIAGNOSIS — M9905 Segmental and somatic dysfunction of pelvic region: Secondary | ICD-10-CM | POA: Diagnosis not present

## 2024-05-13 ENCOUNTER — Ambulatory Visit (INDEPENDENT_AMBULATORY_CARE_PROVIDER_SITE_OTHER): Admitting: Cardiovascular Disease

## 2024-05-13 ENCOUNTER — Encounter: Payer: Self-pay | Admitting: Cardiovascular Disease

## 2024-05-13 VITALS — BP 124/66 | HR 62 | Ht 63.0 in | Wt 120.0 lb

## 2024-05-13 DIAGNOSIS — E782 Mixed hyperlipidemia: Secondary | ICD-10-CM

## 2024-05-13 DIAGNOSIS — R200 Anesthesia of skin: Secondary | ICD-10-CM

## 2024-05-13 DIAGNOSIS — R9431 Abnormal electrocardiogram [ECG] [EKG]: Secondary | ICD-10-CM

## 2024-05-13 DIAGNOSIS — R0602 Shortness of breath: Secondary | ICD-10-CM

## 2024-05-13 DIAGNOSIS — R202 Paresthesia of skin: Secondary | ICD-10-CM

## 2024-05-13 DIAGNOSIS — I129 Hypertensive chronic kidney disease with stage 1 through stage 4 chronic kidney disease, or unspecified chronic kidney disease: Secondary | ICD-10-CM

## 2024-05-13 NOTE — Progress Notes (Signed)
 Cardiology Office Note   Date:  05/13/2024   ID:  Cindy Patel, DOB October 25, 1945, MRN [id number]  PCP:  Albina GORMAN Dine, MD  Cardiologist:  Denyse Bathe, MD      History of Present Illness: Cindy Patel is a 78 y.o. female who presents for  Chief Complaint  Patient presents with   Acute Visit    Cindy Patel pt. Numbness and tingling in left arm started 3 days ago.    This is a 78 year old white female with past medical history of dementia presented to the office for evaluation because of left arm tingling and numbness for the past 5 days.  Patient denies frank chest pain but has numbness and tingling in the whole left arm associated with some shortness of breath.      Past Medical History:  Diagnosis Date   Alzheimer's dementia (HCC)    Hyperlipidemia    Hypertension    Lumbar stress fracture    Mitral regurgitation    Urolithiasis      Past Surgical History:  Procedure Laterality Date   ABDOMINAL HYSTERECTOMY     CHOLECYSTECTOMY       Current Outpatient Medications  Medication Sig Dispense Refill   acetaminophen (TYLENOL) 500 MG tablet Take 1,000 mg by mouth every 6 (six) hours as needed.     B Complex Vitamins (VITAMIN B COMPLEX PO) Take by mouth.     calcium carbonate 100 mg/ml SUSP Take by mouth.     donepezil  (ARICEPT ) 10 MG tablet Take 1 tablet (10 mg total) by mouth every morning. 90 tablet 0   ibandronate  (BONIVA ) 150 MG tablet Take 1 tablet (150 mg total) by mouth every 30 (thirty) days. Take in the morning with a full glass of water, on an empty stomach, and do not take anything else by mouth or lie down for the next 30 min. 3 tablet 1   losartan  (COZAAR ) 50 MG tablet Take 1 tablet (50 mg total) by mouth daily. 90 tablet 0   memantine  (NAMENDA ) 10 MG tablet Take 1 tablet (10 mg total) by mouth every morning. 90 tablet 0   Omega-3 Fatty Acids (FISH OIL) 1200 MG CAPS Take 1 capsule by mouth daily.     No current facility-administered medications  for this visit.    Allergies:   Iodine    Social History:   reports that she has never smoked. She does not have any smokeless tobacco history on file. She reports current alcohol use. She reports that she does not use drugs.   Family History:  family history is not on file.    ROS:     Review of Systems  Constitutional: Negative.   HENT: Negative.    Eyes: Negative.   Respiratory: Negative.    Gastrointestinal: Negative.   Genitourinary: Negative.   Musculoskeletal: Negative.   Skin: Negative.   Neurological: Negative.   Endo/Heme/Allergies: Negative.   Psychiatric/Behavioral: Negative.    All other systems reviewed and are negative.     All other systems are reviewed and negative.    PHYSICAL EXAM: VS:  BP 124/66   Pulse 62   Ht 5' 3 (1.6 m)   Wt 120 lb (54.4 kg)   SpO2 98%   BMI 21.26 kg/m  , BMI Body mass index is 21.26 kg/m. Last weight:  Wt Readings from Last 3 Encounters:  05/13/24 120 lb (54.4 kg)  04/21/24 120 lb (54.4 kg)  04/15/24 120 lb (54.4 kg)  Physical Exam Constitutional:      Appearance: Normal appearance.  Cardiovascular:     Rate and Rhythm: Normal rate and regular rhythm.     Heart sounds: Normal heart sounds.  Pulmonary:     Effort: Pulmonary effort is normal.     Breath sounds: Normal breath sounds.  Musculoskeletal:     Right lower leg: No edema.     Left lower leg: No edema.  Neurological:     Mental Status: She is alert.       EKG: Sinus bradycardia 58/min LAE, non specific st changes  Recent Labs: 04/15/2024: ALT 19; BUN 21; Creatinine, Ser 1.23; Potassium 4.3; Sodium 142    Lipid Panel    Component Value Date/Time   CHOL 216 (H) 04/15/2024 0939   TRIG 84 04/15/2024 0939   HDL 80 04/15/2024 0939   CHOLHDL 2.7 04/15/2024 0939   LDLCALC 121 (H) 04/15/2024 0939      Other studies Reviewed: Additional studies/ records that were reviewed today include:  Review of the above records demonstrates:       No  data to display            ASSESSMENT AND PLAN:    ICD-10-CM   1. Moderate mixed hyperlipidemia not requiring statin therapy  E78.2 PCV ECHOCARDIOGRAM COMPLETE    MYOCARDIAL PERFUSION IMAGING    2. Benign hypertensive kidney disease with chronic kidney disease stage I through stage IV, or unspecified  I12.9 PCV ECHOCARDIOGRAM COMPLETE    MYOCARDIAL PERFUSION IMAGING    3. SOB (shortness of breath)  R06.02 PCV ECHOCARDIOGRAM COMPLETE    MYOCARDIAL PERFUSION IMAGING    4. Numbness and tingling in left arm  R20.0 PCV ECHOCARDIOGRAM COMPLETE   R20.2 MYOCARDIAL PERFUSION IMAGING   Advise echo stress test as has atypical symptoms abnormal EKG. R/O CAD    5. Abnormal EKG  R94.31 PCV ECHOCARDIOGRAM COMPLETE    MYOCARDIAL PERFUSION IMAGING   Has atypical symptoms, non-specific st and t changes.       Problem List Items Addressed This Visit       Genitourinary   Benign hypertensive kidney disease with chronic kidney disease   Relevant Orders   PCV ECHOCARDIOGRAM COMPLETE   MYOCARDIAL PERFUSION IMAGING     Other   Moderate mixed hyperlipidemia not requiring statin therapy - Primary   Relevant Orders   PCV ECHOCARDIOGRAM COMPLETE   MYOCARDIAL PERFUSION IMAGING   Other Visit Diagnoses       SOB (shortness of breath)       Relevant Orders   PCV ECHOCARDIOGRAM COMPLETE   MYOCARDIAL PERFUSION IMAGING     Numbness and tingling in left arm       Advise echo stress test as has atypical symptoms abnormal EKG. R/O CAD   Relevant Orders   PCV ECHOCARDIOGRAM COMPLETE   MYOCARDIAL PERFUSION IMAGING     Abnormal EKG       Has atypical symptoms, non-specific st and t changes.   Relevant Orders   PCV ECHOCARDIOGRAM COMPLETE   MYOCARDIAL PERFUSION IMAGING          Disposition:   Return in about 4 weeks (around 06/10/2024) for Echo and a stress test and a follow-up.    Total time spent: 50 minutes  Signed,  Denyse Bathe, MD  05/13/2024 9:51 AM    [facility]

## 2024-05-13 NOTE — Progress Notes (Signed)
 Cindy Patel                                          MRN: [id number]   05/13/2024   The [facility] Quality Team Specialist reviewed this patient medical record for the purposes of chart review for care gap closure. The following were reviewed: chart review for care gap closure-controlling blood pressure.    [facility] Quality Team

## 2024-05-14 ENCOUNTER — Encounter: Payer: Self-pay | Admitting: Cardiovascular Disease

## 2024-05-17 DIAGNOSIS — R4189 Other symptoms and signs involving cognitive functions and awareness: Secondary | ICD-10-CM | POA: Diagnosis not present

## 2024-05-17 DIAGNOSIS — M9903 Segmental and somatic dysfunction of lumbar region: Secondary | ICD-10-CM | POA: Diagnosis not present

## 2024-05-17 DIAGNOSIS — M9905 Segmental and somatic dysfunction of pelvic region: Secondary | ICD-10-CM | POA: Diagnosis not present

## 2024-05-17 DIAGNOSIS — Z77120 Contact with and (suspected) exposure to mold (toxic): Secondary | ICD-10-CM | POA: Diagnosis not present

## 2024-05-17 DIAGNOSIS — M5412 Radiculopathy, cervical region: Secondary | ICD-10-CM | POA: Diagnosis not present

## 2024-05-17 DIAGNOSIS — M9901 Segmental and somatic dysfunction of cervical region: Secondary | ICD-10-CM | POA: Diagnosis not present

## 2024-05-17 DIAGNOSIS — F0390 Unspecified dementia without behavioral disturbance: Secondary | ICD-10-CM | POA: Diagnosis not present

## 2024-05-19 DIAGNOSIS — M5412 Radiculopathy, cervical region: Secondary | ICD-10-CM | POA: Diagnosis not present

## 2024-05-19 DIAGNOSIS — M9901 Segmental and somatic dysfunction of cervical region: Secondary | ICD-10-CM | POA: Diagnosis not present

## 2024-05-19 DIAGNOSIS — M9905 Segmental and somatic dysfunction of pelvic region: Secondary | ICD-10-CM | POA: Diagnosis not present

## 2024-05-19 DIAGNOSIS — M9903 Segmental and somatic dysfunction of lumbar region: Secondary | ICD-10-CM | POA: Diagnosis not present

## 2024-05-21 LAB — GENECONNECT MOLECULAR SCREEN: Genetic Analysis Overall Interpretation: NEGATIVE

## 2024-05-24 DIAGNOSIS — M9905 Segmental and somatic dysfunction of pelvic region: Secondary | ICD-10-CM | POA: Diagnosis not present

## 2024-05-24 DIAGNOSIS — M9901 Segmental and somatic dysfunction of cervical region: Secondary | ICD-10-CM | POA: Diagnosis not present

## 2024-05-24 DIAGNOSIS — M9903 Segmental and somatic dysfunction of lumbar region: Secondary | ICD-10-CM | POA: Diagnosis not present

## 2024-05-24 DIAGNOSIS — M5412 Radiculopathy, cervical region: Secondary | ICD-10-CM | POA: Diagnosis not present

## 2024-06-07 DIAGNOSIS — Z77120 Contact with and (suspected) exposure to mold (toxic): Secondary | ICD-10-CM | POA: Diagnosis not present

## 2024-06-08 DIAGNOSIS — M5412 Radiculopathy, cervical region: Secondary | ICD-10-CM | POA: Diagnosis not present

## 2024-06-08 DIAGNOSIS — M9905 Segmental and somatic dysfunction of pelvic region: Secondary | ICD-10-CM | POA: Diagnosis not present

## 2024-06-08 DIAGNOSIS — M9903 Segmental and somatic dysfunction of lumbar region: Secondary | ICD-10-CM | POA: Diagnosis not present

## 2024-06-08 DIAGNOSIS — M9901 Segmental and somatic dysfunction of cervical region: Secondary | ICD-10-CM | POA: Diagnosis not present

## 2024-06-22 DIAGNOSIS — M9901 Segmental and somatic dysfunction of cervical region: Secondary | ICD-10-CM | POA: Diagnosis not present

## 2024-06-22 DIAGNOSIS — M5412 Radiculopathy, cervical region: Secondary | ICD-10-CM | POA: Diagnosis not present

## 2024-06-22 DIAGNOSIS — M9903 Segmental and somatic dysfunction of lumbar region: Secondary | ICD-10-CM | POA: Diagnosis not present

## 2024-06-22 DIAGNOSIS — M9905 Segmental and somatic dysfunction of pelvic region: Secondary | ICD-10-CM | POA: Diagnosis not present

## 2024-07-06 DIAGNOSIS — M9903 Segmental and somatic dysfunction of lumbar region: Secondary | ICD-10-CM | POA: Diagnosis not present

## 2024-07-06 DIAGNOSIS — M5412 Radiculopathy, cervical region: Secondary | ICD-10-CM | POA: Diagnosis not present

## 2024-07-06 DIAGNOSIS — M9901 Segmental and somatic dysfunction of cervical region: Secondary | ICD-10-CM | POA: Diagnosis not present

## 2024-07-06 DIAGNOSIS — M9905 Segmental and somatic dysfunction of pelvic region: Secondary | ICD-10-CM | POA: Diagnosis not present

## 2024-08-02 ENCOUNTER — Other Ambulatory Visit

## 2024-08-03 ENCOUNTER — Ambulatory Visit: Admitting: Internal Medicine

## 2024-08-03 LAB — COMPREHENSIVE METABOLIC PANEL WITH GFR
ALT: 14 IU/L (ref 0–32)
AST: 22 IU/L (ref 0–40)
Albumin: 3.9 g/dL (ref 3.8–4.8)
Alkaline Phosphatase: 149 IU/L — ABNORMAL HIGH (ref 49–135)
BUN/Creatinine Ratio: 14 (ref 12–28)
BUN: 16 mg/dL (ref 8–27)
Bilirubin Total: 0.3 mg/dL (ref 0.0–1.2)
CO2: 25 mmol/L (ref 20–29)
Calcium: 9 mg/dL (ref 8.7–10.3)
Chloride: 107 mmol/L — ABNORMAL HIGH (ref 96–106)
Creatinine, Ser: 1.18 mg/dL — ABNORMAL HIGH (ref 0.57–1.00)
Globulin, Total: 2.5 g/dL (ref 1.5–4.5)
Glucose: 88 mg/dL (ref 70–99)
Potassium: 4.2 mmol/L (ref 3.5–5.2)
Sodium: 143 mmol/L (ref 134–144)
Total Protein: 6.4 g/dL (ref 6.0–8.5)
eGFR: 47 mL/min/1.73 — ABNORMAL LOW

## 2024-08-03 LAB — LIPID PANEL
Chol/HDL Ratio: 2.4 ratio (ref 0.0–4.4)
Cholesterol, Total: 204 mg/dL — ABNORMAL HIGH (ref 100–199)
HDL: 84 mg/dL
LDL Chol Calc (NIH): 110 mg/dL — ABNORMAL HIGH (ref 0–99)
Triglycerides: 56 mg/dL (ref 0–149)
VLDL Cholesterol Cal: 10 mg/dL (ref 5–40)

## 2024-08-03 LAB — CBC WITH DIFF/PLATELET
Basophils Absolute: 0 x10E3/uL (ref 0.0–0.2)
Basos: 0 %
EOS (ABSOLUTE): 0.1 x10E3/uL (ref 0.0–0.4)
Eos: 2 %
Hematocrit: 38.2 % (ref 34.0–46.6)
Hemoglobin: 12.4 g/dL (ref 11.1–15.9)
Immature Grans (Abs): 0 x10E3/uL (ref 0.0–0.1)
Immature Granulocytes: 0 %
Lymphocytes Absolute: 1.1 x10E3/uL (ref 0.7–3.1)
Lymphs: 14 %
MCH: 30.2 pg (ref 26.6–33.0)
MCHC: 32.5 g/dL (ref 31.5–35.7)
MCV: 93 fL (ref 79–97)
Monocytes Absolute: 0.7 x10E3/uL (ref 0.1–0.9)
Monocytes: 9 %
Neutrophils Absolute: 6.1 x10E3/uL (ref 1.4–7.0)
Neutrophils: 75 %
Platelets: 145 x10E3/uL — ABNORMAL LOW (ref 150–450)
RBC: 4.1 x10E6/uL (ref 3.77–5.28)
RDW: 12.6 % (ref 11.7–15.4)
WBC: 8.1 x10E3/uL (ref 3.4–10.8)

## 2024-08-04 ENCOUNTER — Encounter: Payer: Self-pay | Admitting: Internal Medicine

## 2024-08-04 ENCOUNTER — Ambulatory Visit: Admitting: Internal Medicine

## 2024-08-04 ENCOUNTER — Ambulatory Visit: Payer: Self-pay | Admitting: Internal Medicine

## 2024-08-04 VITALS — BP 130/72 | HR 70 | Temp 99.4°F | Ht 63.0 in | Wt 122.6 lb

## 2024-08-04 DIAGNOSIS — N1831 Chronic kidney disease, stage 3a: Secondary | ICD-10-CM

## 2024-08-04 DIAGNOSIS — G309 Alzheimer's disease, unspecified: Secondary | ICD-10-CM

## 2024-08-04 DIAGNOSIS — D696 Thrombocytopenia, unspecified: Secondary | ICD-10-CM | POA: Diagnosis not present

## 2024-08-04 DIAGNOSIS — M810 Age-related osteoporosis without current pathological fracture: Secondary | ICD-10-CM | POA: Diagnosis not present

## 2024-08-04 DIAGNOSIS — Z0001 Encounter for general adult medical examination with abnormal findings: Secondary | ICD-10-CM

## 2024-08-04 DIAGNOSIS — F02B0 Dementia in other diseases classified elsewhere, moderate, without behavioral disturbance, psychotic disturbance, mood disturbance, and anxiety: Secondary | ICD-10-CM

## 2024-08-04 DIAGNOSIS — I10 Essential (primary) hypertension: Secondary | ICD-10-CM

## 2024-08-04 DIAGNOSIS — G308 Other Alzheimer's disease: Secondary | ICD-10-CM

## 2024-08-04 DIAGNOSIS — E782 Mixed hyperlipidemia: Secondary | ICD-10-CM

## 2024-08-04 MED ORDER — DONEPEZIL HCL 10 MG PO TABS: 10.0000 mg | ORAL_TABLET | Freq: Every morning | ORAL | 1 refills | Status: AC

## 2024-08-04 MED ORDER — LOSARTAN POTASSIUM 50 MG PO TABS: 50.0000 mg | ORAL_TABLET | Freq: Every day | ORAL | 1 refills | Status: AC

## 2024-08-04 MED ORDER — MEMANTINE HCL 10 MG PO TABS: 10.0000 mg | ORAL_TABLET | Freq: Every morning | ORAL | 1 refills | Status: AC

## 2024-08-04 NOTE — Progress Notes (Signed)
 "  Established Patient Office Visit  Subjective:  Patient ID: Cindy Patel, female    DOB: 1946/02/09  Age: 79 y.o. MRN: [id number]  Chief Complaint  Patient presents with   Annual Exam    AWV    No new complaints, here for AWV refer to quality metrics and scanned documents.  Labs reviewed and notable for stable gfr, elevated lipids but drop in platelet count. Recently started a new otc supplement.     No other concerns at this time.   Past Medical History:  Diagnosis Date   Alzheimer'Cindy Patel dementia (HCC)    Hyperlipidemia    Hypertension    Lumbar stress fracture    Mitral regurgitation    Urolithiasis     Past Surgical History:  Procedure Laterality Date   ABDOMINAL HYSTERECTOMY     CHOLECYSTECTOMY      Social History   Socioeconomic History   Marital status: Married    Spouse name: Not on file   Number of children: Not on file   Years of education: Not on file   Highest education level: Not on file  Occupational History   Occupation: Retired  Tobacco Use   Smoking status: Never   Smokeless tobacco: Not on file  Vaping Use   Vaping status: Never Used  Substance and Sexual Activity   Alcohol use: Yes   Drug use: Never   Sexual activity: Yes  Other Topics Concern   Not on file  Social History Narrative   Not on file   Social Drivers of Health   Tobacco Use: Unknown (08/04/2024)   Patient History    Smoking Tobacco Use: Never    Smokeless Tobacco Use: Unknown    Passive Exposure: Not on file  Financial Resource Strain: Not on file  Food Insecurity: Not on file  Transportation Needs: Not on file  Physical Activity: Not on file  Stress: Not on file  Social Connections: Not on file  Intimate Partner Violence: Not on file  Depression (PHQ2-9): Low Risk (04/15/2023)   Depression (PHQ2-9)    PHQ-2 Score: 1  Alcohol Screen: Not on file  Housing: Not on file  Utilities: Not on file  Health Literacy: Not on file    Family History  Problem  Relation Age of Onset   Breast cancer Neg Hx     Allergies[1]  Show/hide medication list[2]  Review of Systems  Constitutional: Negative.  Negative for weight loss (gained 2 lbs).  HENT: Negative.    Eyes: Negative.   Respiratory: Negative.    Cardiovascular: Negative.   Gastrointestinal: Negative.   Genitourinary: Negative.   Skin: Negative.   Neurological: Negative.   Endo/Heme/Allergies: Negative.   Psychiatric/Behavioral:  Positive for memory loss.        Objective:   BP 130/72   Pulse 70   Temp 99.4 F (37.4 C) (Tympanic)   Ht 5' 3 (1.6 m)   Wt 122 lb 9.6 oz (55.6 kg)   SpO2 98%   BMI 21.72 kg/m   Vitals:   08/04/24 1038  BP: 130/72  Pulse: 70  Temp: 99.4 F (37.4 C)  Height: 5' 3 (1.6 m)  Weight: 122 lb 9.6 oz (55.6 kg)  SpO2: 98%  TempSrc: Tympanic  BMI (Calculated): 21.72    Physical Exam Vitals reviewed.  Constitutional:      General: She is not in acute distress. HENT:     Head: Normocephalic.     Nose: Nose normal.     Mouth/Throat:  Mouth: Mucous membranes are moist.  Eyes:     Extraocular Movements: Extraocular movements intact.     Pupils: Pupils are equal, round, and reactive to light.  Cardiovascular:     Rate and Rhythm: Normal rate and regular rhythm.     Heart sounds: No murmur heard. Pulmonary:     Effort: Pulmonary effort is normal.     Breath sounds: No rhonchi or rales.  Abdominal:     General: Abdomen is flat.     Palpations: There is no hepatomegaly, splenomegaly or mass.  Musculoskeletal:        General: Normal range of motion.     Cervical back: Normal range of motion. No tenderness.  Skin:    General: Skin is warm and dry.  Neurological:     General: No focal deficit present.     Mental Status: She is alert and oriented to person, place, and time.     Cranial Nerves: No cranial nerve deficit.     Motor: No weakness.     Deep Tendon Reflexes:     Reflex Scores:      Tricep reflexes are 4+ on the right  side and 4+ on the left side.      Bicep reflexes are 4+ on the right side and 4+ on the left side.      Brachioradialis reflexes are 4+ on the right side and 4+ on the left side.      Patellar reflexes are 4+ on the right side and 4+ on the left side. Psychiatric:        Mood and Affect: Mood normal.        Behavior: Behavior normal.      No results found for any visits on 08/04/24.  Recent Results (from the past 2160 hours)  [facility] Molecular Screen - Blood ([facility])     Status: None   Collection Time: 05/11/24  9:05 AM  Result Value Ref Range   Genetic Analysis Overall Interpretation Negative    Genetic Disease Assessed      This is a screening test and does not detect all pathogenic or likely pathogenic variant(Cindy Patel) in the tested genes; diagnostic testing is recommended for individuals with a personal or family history of heart disease or hereditary cancer. [facility] Tier One  Population Screen is a screening test that analyzes 11 genes related to hereditary breast and ovarian cancer (HBOC) syndrome, Lynch syndrome, and familial hypercholesterolemia. This test only reports clinically significant pathogenic and likely  pathogenic variants but does not report variants of uncertain significance (VUS). In addition, analysis of the PMS2 gene excludes exons 11-15, which overlap with a known pseudogene (PMS2CL).    Genetic Analysis Report      No pathogenic or likely pathogenic variants were detected in the genes analyzed by this test.Genetic test results should be interpreted in the context of an individual'Cindy Patel personal medical and family history. Alteration to medical management is NOT  recommended based solely on this result. Clinical correlation is advised.Additional Considerations- This is a screening test; individuals may still carry pathogenic or likely pathogenic variant(Cindy Patel) in the tested genes that are not detected by this test.-  For individuals at risk for these or other  related conditions based on factors including personal or family history, diagnostic testing is recommended.- The absence of pathogenic or likely pathogenic variant(Cindy Patel) in the analyzed genes, while reassuring,  does not eliminate the possibility of a hereditary condition; there are other variants and genes associated with  heart disease and hereditary cancer that are not included in this test.    Genes Tested See Notes     Comment: APOB, BRCA1, BRCA2, EPCAM, LDLR, LDLRAP1, PCSK9, PMS2, MLH1, MSH2, MSH6   Disclaimer See Notes     Comment: This test was developed and validated by [facility] This test has not been cleared or approved by the [place]  Food and Drug Administration (FDA). The [facility] laboratory is accredited by the College of American Pathologists (CAP) and certified under  the Clinical Laboratory Improvement Amendments (CLIA #: [id number]) to perform high-complexity clinical tests. This test is used for clinical purposes. It should not be regarded as investigational use only or for research use only.    Sequencing Location See Notes     Comment: Sequencing done at [facility] [address], [address], [place] [address] (CLIA# [id number])   Interpretation Methods and Limitations See Notes     Comment: Extracted DNA is enriched for targeted regions and then sequenced using the [facility] Exome+ (R) assay on an Illumina DNA sequencing system. Data is then aligned to a modified version of GRCh38 and all genes are analyzed using the MANE transcript and MANE  Plus Clinical transcript, when available. Small variant calling is completed using a customized version of [employer] DNAseq software, augmented by a proprietary small variant caller for difficult variants. Copy number variants (CNVs) are then called  using a proprietary bioinformatics pipeline based on depth analysis with a comparison to similarly sequenced samples. Analysis of the PMS2 gene is limited to exons 1-10. Both the  MSH2 Boland inversion (exons 1-7) and the BRCA2 Alu insertion are detected  by identifying discordant read-pairs spanning the breakpoints. The interpretation and reporting of variants in APOB, PCSK9, and LDLR is specific to familial hypercholesterolemia; variants associated with hypobetalipoproteinemia are not i ncluded.  Interpretation is based upon guidelines published by the [employer]), the Association for Molecular Pathology (AMP) or their modification by [employer] when available and/or review  of previous clinical assertions available in the [employer]. Interpretation is limited to the transcripts indicated on the report and +/- 10 bp into intronic regions, except as noted below. [facility] variant classifications include pathogenic, likely  pathogenic, variant of uncertain significance (VUS), likely benign, and benign. Only variants classified as pathogenic and likely pathogenic are included in the report. All reported variants are confirmed through secondary manual inspection of DNA  sequence data or orthogonal testing. Risk estimations and management guidelines included in this report are based on analysis of primary literature and recommendations of applicable professional societies, and should be regarded  as approximations.Based  on validation studies, this assay delivers > 99% sensitivity and specificity for single nucleotide variants and insertions and deletions (indels) up to 20 bp. Larger indels and complex variants are also reported but sensitivity may be reduced. Based on  validation studies, this assay delivers > 99% sensitivity to multi-exon CNVs and > 90% sensitivity to single-exon CNVs. This test may not detect variants in challenging regions (such as short tandem repeats, homopolymer runs, and segment duplications),  sub-exonic CNVs, chromosomal aneuploidy, or variants in the presence of mosaicism. Phasing will  be attempted and reported, when possible. Structural rearrangements such as inversions, translocations, complex rearrangements, and gene conversions are not  tested in this assay unless explicitly indicated. Additionally, deep intronic, promoter, and enhancer regions may not be covered. It is important to note that this is a screening  test and cannot detect all di sease-causing variants. A negative result does  not guarantee the absence of a rare, undetectable variant in the genes analyzed; consider using a diagnostic test if there is significant personal and/or family history of one of the conditions analyzed by this test. Any potential incidental findings  outside of these genes and conditions will not be identified, nor reported. The results of a genetic test may be influenced by various factors, including bone marrow transplantation, blood transfusions, or in rare cases, hematolymphoid neoplasms.Gene  Specific Notes:APOB: analysis is limited to c.10580G>A and c.10579C>T; BRCA1: sequencing analysis extends to CDS +/-20 bp; BRCA2: analysis includes detection of [id number] and sequencing analysis extends to CDS +/-20 bp. EPCAM: analysis is limited  to CNV of exons 8-9; LDLR: analysis includes CNV of the promoter; MLH1: analysis includes CNV of the promoter; MSH2: analysis includes detection of the Boland inversion (inversion of exons 1 -7) and detection of c.942+3A>T, PMS2: analysis is limited to  exons 1-10.Donnice JINNY Kemp, PhD, [email or web address]   Comprehensive metabolic panel with GFR     Status: Abnormal   Collection Time: 08/02/24 10:17 AM  Result Value Ref Range   Glucose 88 70 - 99 mg/dL   BUN 16 8 - 27 mg/dL   Creatinine, Ser 8.81 (H) 0.57 - 1.00 mg/dL   eGFR 47 (L) >40 fO/fpw/8.26   BUN/Creatinine Ratio 14 12 - 28   Sodium 143 134 - 144 mmol/L   Potassium 4.2 3.5 - 5.2 mmol/L   Chloride 107 (H) 96 - 106 mmol/L   CO2 25 20 - 29 mmol/L   Calcium 9.0 8.7 - 10.3 mg/dL    Total Protein 6.4 6.0 - 8.5 g/dL   Albumin 3.9 3.8 - 4.8 g/dL   Globulin, Total 2.5 1.5 - 4.5 g/dL   Bilirubin Total 0.3 0.0 - 1.2 mg/dL   Alkaline Phosphatase 149 (H) 49 - 135 IU/L   AST 22 0 - 40 IU/L   ALT 14 0 - 32 IU/L  Lipid panel     Status: Abnormal   Collection Time: 08/02/24 10:17 AM  Result Value Ref Range   Cholesterol, Total 204 (H) 100 - 199 mg/dL   Triglycerides 56 0 - 149 mg/dL   HDL 84 >60 mg/dL   VLDL Cholesterol Cal 10 5 - 40 mg/dL   LDL Chol Calc (NIH) 889 (H) 0 - 99 mg/dL   Chol/HDL Ratio 2.4 0.0 - 4.4 ratio    Comment:                                   T. Chol/HDL Ratio                                             Men  Women                               1/2 Avg.Risk  3.4    3.3                                   Avg.Risk  5.0    4.4  2X Avg.Risk  9.6    7.1                                3X Avg.Risk 23.4   11.0   CBC With Diff/Platelet     Status: Abnormal   Collection Time: 08/02/24 10:17 AM  Result Value Ref Range   WBC 8.1 3.4 - 10.8 x10E3/uL   RBC 4.10 3.77 - 5.28 x10E6/uL   Hemoglobin 12.4 11.1 - 15.9 g/dL   Hematocrit 61.7 65.9 - 46.6 %   MCV 93 79 - 97 fL   MCH 30.2 26.6 - 33.0 pg   MCHC 32.5 31.5 - 35.7 g/dL   RDW 87.3 88.2 - 84.5 %   Platelets 145 (L) 150 - 450 x10E3/uL   Neutrophils 75 Not Estab. %   Lymphs 14 Not Estab. %   Monocytes 9 Not Estab. %   Eos 2 Not Estab. %   Basos 0 Not Estab. %   Neutrophils Absolute 6.1 1.4 - 7.0 x10E3/uL   Lymphocytes Absolute 1.1 0.7 - 3.1 x10E3/uL   Monocytes Absolute 0.7 0.1 - 0.9 x10E3/uL   EOS (ABSOLUTE) 0.1 0.0 - 0.4 x10E3/uL   Basophils Absolute 0.0 0.0 - 0.2 x10E3/uL   Immature Granulocytes 0 Not Estab. %   Immature Grans (Abs) 0.0 0.0 - 0.1 x10E3/uL      Assessment & Plan:  Cindy Patel was seen today for annual exam.  CKD stage 3a, GFR 45-59 ml/min (HCC)  Thrombocytopenia -     CBC With Diff/Platelet  Moderate mixed hyperlipidemia not requiring statin  therapy  Age-related osteoporosis without current pathological fracture  Moderate Alzheimer'Gisell Buehrle dementia without behavioral disturbance, psychotic disturbance, mood disturbance, or anxiety, unspecified timing of dementia onset (HCC)  Moderate Alzheimer'Beryl Hornberger dementia of other onset without behavioral disturbance, psychotic disturbance, mood disturbance, or anxiety (HCC) -     Donepezil  HCl; Take 1 tablet (10 mg total) by mouth every morning.  Dispense: 90 tablet; Refill: 1 -     Memantine  HCl; Take 1 tablet (10 mg total) by mouth every morning.  Dispense: 90 tablet; Refill: 1  Primary hypertension -     Losartan  Potassium; Take 1 tablet (50 mg total) by mouth daily.  Dispense: 90 tablet; Refill: 1  Stop taking nanokinase supplement. Drink at least 1.5 L of fluids/day.  Problem List Items Addressed This Visit       Nervous and Auditory   DAT (dementia of Alzheimer type) (HCC)   Relevant Medications   donepezil  (ARICEPT ) 10 MG tablet   memantine  (NAMENDA ) 10 MG tablet     Musculoskeletal and Integument   Age-related osteoporosis without current pathological fracture     Other   Moderate mixed hyperlipidemia not requiring statin therapy   Relevant Medications   losartan  (COZAAR ) 50 MG tablet   Other Visit Diagnoses       CKD stage 3a, GFR 45-59 ml/min (HCC)    -  Primary     Thrombocytopenia       Relevant Orders   CBC With Diff/Platelet     Primary hypertension       Relevant Medications   losartan  (COZAAR ) 50 MG tablet       Return in 3 months (on 11/02/2024) for fu with labs prior.   Total time spent: 30 minutes. This time includes review of previous notes and results and patient face to face interaction during today'Gayanne Prescott visit.    Sherrill Dine  [place], MD  08/04/2024   This document may have been prepared by [facility] Voice Recognition software and as such may include unintentional dictation errors.     [1]  Allergies Allergen Reactions   Iodine Rash  [2]   Outpatient Medications Prior to Visit  Medication Sig   acetaminophen (TYLENOL) 500 MG tablet Take 1,000 mg by mouth every 6 (six) hours as needed.   B Complex Vitamins (VITAMIN B COMPLEX PO) Take by mouth.   calcium carbonate 100 mg/ml SUSP Take by mouth.   Omega-3 Fatty Acids (FISH OIL) 1200 MG CAPS Take 1 capsule by mouth daily.   [DISCONTINUED] donepezil  (ARICEPT ) 10 MG tablet Take 1 tablet (10 mg total) by mouth every morning.   [DISCONTINUED] losartan  (COZAAR ) 50 MG tablet Take 1 tablet (50 mg total) by mouth daily.   [DISCONTINUED] memantine  (NAMENDA ) 10 MG tablet Take 1 tablet (10 mg total) by mouth every morning.   No facility-administered medications prior to visit.   "

## 2024-11-02 ENCOUNTER — Ambulatory Visit: Admitting: Internal Medicine
